# Patient Record
Sex: Male | Born: 1945 | Race: White | Hispanic: No | Marital: Married | Smoking: Former smoker
Health system: Southern US, Community
[De-identification: ages and names within clinical notes are randomized; demographics above are authoritative.]

## PROBLEM LIST (undated history)

## (undated) DIAGNOSIS — F0390 Unspecified dementia without behavioral disturbance: Secondary | ICD-10-CM

## (undated) DIAGNOSIS — E119 Type 2 diabetes mellitus without complications: Secondary | ICD-10-CM

## (undated) DIAGNOSIS — C4490 Unspecified malignant neoplasm of skin, unspecified: Secondary | ICD-10-CM

## (undated) DIAGNOSIS — C439 Malignant melanoma of skin, unspecified: Secondary | ICD-10-CM

## (undated) DIAGNOSIS — I6529 Occlusion and stenosis of unspecified carotid artery: Secondary | ICD-10-CM

## (undated) DIAGNOSIS — G4730 Sleep apnea, unspecified: Secondary | ICD-10-CM

## (undated) DIAGNOSIS — D72829 Elevated white blood cell count, unspecified: Secondary | ICD-10-CM

## (undated) DIAGNOSIS — I2510 Atherosclerotic heart disease of native coronary artery without angina pectoris: Secondary | ICD-10-CM

## (undated) DIAGNOSIS — E782 Mixed hyperlipidemia: Secondary | ICD-10-CM

## (undated) DIAGNOSIS — N289 Disorder of kidney and ureter, unspecified: Secondary | ICD-10-CM

## (undated) DIAGNOSIS — N200 Calculus of kidney: Secondary | ICD-10-CM

## (undated) DIAGNOSIS — R001 Bradycardia, unspecified: Secondary | ICD-10-CM

## (undated) DIAGNOSIS — Z9289 Personal history of other medical treatment: Secondary | ICD-10-CM

## (undated) DIAGNOSIS — I10 Essential (primary) hypertension: Secondary | ICD-10-CM

## (undated) HISTORY — PX: BACK SURGERY: SHX140

## (undated) HISTORY — DX: Personal history of other medical treatment: Z92.89

## (undated) HISTORY — PX: LUMBAR DISC SURGERY: SHX700

## (undated) HISTORY — PX: MELANOMA EXCISION: SHX5266

## (undated) HISTORY — DX: Atherosclerotic heart disease of native coronary artery without angina pectoris: I25.10

## (undated) HISTORY — DX: Occlusion and stenosis of unspecified carotid artery: I65.29

---

## 1988-10-27 HISTORY — PX: LITHOTRIPSY: SUR834

## 1998-05-23 ENCOUNTER — Ambulatory Visit (HOSPITAL_BASED_OUTPATIENT_CLINIC_OR_DEPARTMENT_OTHER): Admission: RE | Admit: 1998-05-23 | Discharge: 1998-05-23 | Payer: Self-pay | Admitting: *Deleted

## 1998-07-12 ENCOUNTER — Ambulatory Visit (HOSPITAL_COMMUNITY): Admission: RE | Admit: 1998-07-12 | Discharge: 1998-07-12 | Payer: Self-pay | Admitting: *Deleted

## 2000-11-11 ENCOUNTER — Ambulatory Visit: Admission: RE | Admit: 2000-11-11 | Discharge: 2000-11-11 | Payer: Self-pay | Admitting: Family Medicine

## 2010-02-26 HISTORY — PX: SKIN CANCER EXCISION: SHX779

## 2011-05-23 DIAGNOSIS — D485 Neoplasm of uncertain behavior of skin: Secondary | ICD-10-CM | POA: Diagnosis not present

## 2011-05-23 DIAGNOSIS — D235 Other benign neoplasm of skin of trunk: Secondary | ICD-10-CM | POA: Diagnosis not present

## 2011-05-23 DIAGNOSIS — L851 Acquired keratosis [keratoderma] palmaris et plantaris: Secondary | ICD-10-CM | POA: Diagnosis not present

## 2011-05-23 DIAGNOSIS — L570 Actinic keratosis: Secondary | ICD-10-CM | POA: Diagnosis not present

## 2011-06-04 DIAGNOSIS — IMO0001 Reserved for inherently not codable concepts without codable children: Secondary | ICD-10-CM | POA: Diagnosis not present

## 2011-06-04 DIAGNOSIS — E669 Obesity, unspecified: Secondary | ICD-10-CM | POA: Diagnosis not present

## 2011-06-04 DIAGNOSIS — E785 Hyperlipidemia, unspecified: Secondary | ICD-10-CM | POA: Diagnosis not present

## 2011-06-04 DIAGNOSIS — I10 Essential (primary) hypertension: Secondary | ICD-10-CM | POA: Diagnosis not present

## 2011-06-08 DIAGNOSIS — N200 Calculus of kidney: Secondary | ICD-10-CM | POA: Diagnosis not present

## 2011-06-08 DIAGNOSIS — E669 Obesity, unspecified: Secondary | ICD-10-CM | POA: Diagnosis not present

## 2011-06-08 DIAGNOSIS — E782 Mixed hyperlipidemia: Secondary | ICD-10-CM | POA: Diagnosis not present

## 2011-06-08 DIAGNOSIS — F0390 Unspecified dementia without behavioral disturbance: Secondary | ICD-10-CM | POA: Diagnosis not present

## 2011-06-08 DIAGNOSIS — I10 Essential (primary) hypertension: Secondary | ICD-10-CM | POA: Diagnosis not present

## 2011-06-08 DIAGNOSIS — G4733 Obstructive sleep apnea (adult) (pediatric): Secondary | ICD-10-CM | POA: Diagnosis not present

## 2011-10-11 DIAGNOSIS — E119 Type 2 diabetes mellitus without complications: Secondary | ICD-10-CM | POA: Diagnosis not present

## 2011-10-11 DIAGNOSIS — E782 Mixed hyperlipidemia: Secondary | ICD-10-CM | POA: Diagnosis not present

## 2011-10-17 DIAGNOSIS — E782 Mixed hyperlipidemia: Secondary | ICD-10-CM | POA: Diagnosis not present

## 2011-10-17 DIAGNOSIS — G4733 Obstructive sleep apnea (adult) (pediatric): Secondary | ICD-10-CM | POA: Diagnosis not present

## 2011-10-17 DIAGNOSIS — I10 Essential (primary) hypertension: Secondary | ICD-10-CM | POA: Diagnosis not present

## 2011-10-17 DIAGNOSIS — R42 Dizziness and giddiness: Secondary | ICD-10-CM | POA: Diagnosis not present

## 2011-10-17 DIAGNOSIS — E669 Obesity, unspecified: Secondary | ICD-10-CM | POA: Diagnosis not present

## 2011-10-17 DIAGNOSIS — N200 Calculus of kidney: Secondary | ICD-10-CM | POA: Diagnosis not present

## 2011-10-17 DIAGNOSIS — F0390 Unspecified dementia without behavioral disturbance: Secondary | ICD-10-CM | POA: Diagnosis not present

## 2011-11-26 DIAGNOSIS — Z85828 Personal history of other malignant neoplasm of skin: Secondary | ICD-10-CM | POA: Diagnosis not present

## 2011-11-26 DIAGNOSIS — L570 Actinic keratosis: Secondary | ICD-10-CM | POA: Diagnosis not present

## 2011-11-26 DIAGNOSIS — D485 Neoplasm of uncertain behavior of skin: Secondary | ICD-10-CM | POA: Diagnosis not present

## 2012-01-27 HISTORY — PX: CARDIAC CATHETERIZATION: SHX172

## 2012-02-03 ENCOUNTER — Encounter (HOSPITAL_COMMUNITY): Payer: Self-pay | Admitting: Emergency Medicine

## 2012-02-03 ENCOUNTER — Inpatient Hospital Stay (HOSPITAL_COMMUNITY)
Admission: AD | Admit: 2012-02-03 | Discharge: 2012-02-05 | DRG: 282 | Disposition: A | Discharge status: Home / Self Care | Payer: Medicare Other | Source: Other Acute Inpatient Hospital | Attending: Cardiology | Admitting: Cardiology

## 2012-02-03 DIAGNOSIS — F0390 Unspecified dementia without behavioral disturbance: Secondary | ICD-10-CM | POA: Diagnosis present

## 2012-02-03 DIAGNOSIS — I200 Unstable angina: Secondary | ICD-10-CM | POA: Diagnosis not present

## 2012-02-03 DIAGNOSIS — I2510 Atherosclerotic heart disease of native coronary artery without angina pectoris: Secondary | ICD-10-CM | POA: Diagnosis present

## 2012-02-03 DIAGNOSIS — I2582 Chronic total occlusion of coronary artery: Secondary | ICD-10-CM | POA: Diagnosis present

## 2012-02-03 DIAGNOSIS — G309 Alzheimer's disease, unspecified: Secondary | ICD-10-CM | POA: Diagnosis not present

## 2012-02-03 DIAGNOSIS — E785 Hyperlipidemia, unspecified: Secondary | ICD-10-CM | POA: Diagnosis not present

## 2012-02-03 DIAGNOSIS — I214 Non-ST elevation (NSTEMI) myocardial infarction: Principal | ICD-10-CM | POA: Diagnosis present

## 2012-02-03 DIAGNOSIS — Z79899 Other long term (current) drug therapy: Secondary | ICD-10-CM | POA: Diagnosis not present

## 2012-02-03 DIAGNOSIS — E119 Type 2 diabetes mellitus without complications: Secondary | ICD-10-CM | POA: Diagnosis present

## 2012-02-03 DIAGNOSIS — F0280 Dementia in other diseases classified elsewhere without behavioral disturbance: Secondary | ICD-10-CM | POA: Diagnosis not present

## 2012-02-03 DIAGNOSIS — I6529 Occlusion and stenosis of unspecified carotid artery: Secondary | ICD-10-CM | POA: Diagnosis present

## 2012-02-03 DIAGNOSIS — I219 Acute myocardial infarction, unspecified: Secondary | ICD-10-CM | POA: Diagnosis not present

## 2012-02-03 DIAGNOSIS — N289 Disorder of kidney and ureter, unspecified: Secondary | ICD-10-CM | POA: Diagnosis not present

## 2012-02-03 DIAGNOSIS — Z8249 Family history of ischemic heart disease and other diseases of the circulatory system: Secondary | ICD-10-CM

## 2012-02-03 DIAGNOSIS — I129 Hypertensive chronic kidney disease with stage 1 through stage 4 chronic kidney disease, or unspecified chronic kidney disease: Secondary | ICD-10-CM | POA: Diagnosis not present

## 2012-02-03 DIAGNOSIS — R079 Chest pain, unspecified: Secondary | ICD-10-CM | POA: Diagnosis not present

## 2012-02-03 DIAGNOSIS — E782 Mixed hyperlipidemia: Secondary | ICD-10-CM | POA: Diagnosis present

## 2012-02-03 DIAGNOSIS — Z23 Encounter for immunization: Secondary | ICD-10-CM | POA: Diagnosis not present

## 2012-02-03 DIAGNOSIS — E7800 Pure hypercholesterolemia, unspecified: Secondary | ICD-10-CM | POA: Diagnosis present

## 2012-02-03 DIAGNOSIS — I10 Essential (primary) hypertension: Secondary | ICD-10-CM | POA: Diagnosis present

## 2012-02-03 DIAGNOSIS — N189 Chronic kidney disease, unspecified: Secondary | ICD-10-CM | POA: Diagnosis not present

## 2012-02-03 DIAGNOSIS — R0989 Other specified symptoms and signs involving the circulatory and respiratory systems: Secondary | ICD-10-CM | POA: Diagnosis not present

## 2012-02-03 DIAGNOSIS — E876 Hypokalemia: Secondary | ICD-10-CM | POA: Diagnosis not present

## 2012-02-03 DIAGNOSIS — Z880 Allergy status to penicillin: Secondary | ICD-10-CM | POA: Diagnosis not present

## 2012-02-03 HISTORY — DX: Mixed hyperlipidemia: E78.2

## 2012-02-03 HISTORY — DX: Essential (primary) hypertension: I10

## 2012-02-03 HISTORY — DX: Type 2 diabetes mellitus without complications: E11.9

## 2012-02-03 LAB — BASIC METABOLIC PANEL
BUN: 18 mg/dL (ref 6–23)
CO2: 23 mEq/L (ref 19–32)
Chloride: 100 mEq/L (ref 96–112)
Creatinine, Ser: 1 mg/dL (ref 0.50–1.35)
GFR calc Af Amer: 88 mL/min — ABNORMAL LOW (ref >90)
Glucose, Bld: 206 mg/dL — ABNORMAL HIGH (ref 70–99)
Potassium: 3.9 mEq/L (ref 3.5–5.1)

## 2012-02-03 LAB — TROPONIN I: Troponin I: <0.3 ng/mL (ref <0.30)

## 2012-02-03 MED ORDER — SODIUM CHLORIDE 0.9 % IJ SOLN: 3.0000 mL | INTRAMUSCULAR | Status: DC | PRN

## 2012-02-03 MED ORDER — SODIUM CHLORIDE 0.9 % IJ SOLN: 3.0000 mL | Freq: Two times a day (BID) | INTRAMUSCULAR | Status: DC

## 2012-02-03 MED ORDER — ASPIRIN EC 81 MG PO TBEC
81.0000 mg | DELAYED_RELEASE_TABLET | Freq: Every day | ORAL | Status: DC
  Administered 2012-02-05: 10:00:00 81 mg via ORAL
  Filled 2012-02-03 (×2): qty 1

## 2012-02-03 MED ORDER — ASPIRIN 300 MG RE SUPP
300.0000 mg | RECTAL | Status: AC
  Filled 2012-02-03: qty 1

## 2012-02-03 MED ORDER — SODIUM CHLORIDE 0.9 % IV SOLN: 250.0000 mL | INTRAVENOUS | Status: DC | PRN

## 2012-02-03 MED ORDER — SODIUM CHLORIDE 0.9 % IJ SOLN
3.0000 mL | Freq: Two times a day (BID) | INTRAMUSCULAR | Status: DC
  Administered 2012-02-03 – 2012-02-04 (×3): 3 mL via INTRAVENOUS

## 2012-02-03 MED ORDER — RIVASTIGMINE 4.6 MG/24HR TD PT24
4.6000 mg | MEDICATED_PATCH | Freq: Every day | TRANSDERMAL | Status: DC
  Administered 2012-02-03 – 2012-02-05 (×3): 4.6 mg via TRANSDERMAL
  Filled 2012-02-03 (×4): qty 1

## 2012-02-03 MED ORDER — ATORVASTATIN CALCIUM 80 MG PO TABS
80.0000 mg | ORAL_TABLET | Freq: Every day | ORAL | Status: DC
  Administered 2012-02-03 – 2012-02-04 (×2): 80 mg via ORAL
  Filled 2012-02-03 (×4): qty 1

## 2012-02-03 MED ORDER — INFLUENZA VIRUS VACC SPLIT PF IM SUSP
0.5000 mL | INTRAMUSCULAR | Status: AC
  Administered 2012-02-05: 10:00:00 0.5 mL via INTRAMUSCULAR
  Filled 2012-02-03: qty 0.5

## 2012-02-03 MED ORDER — ONDANSETRON HCL 4 MG/2ML IJ SOLN: 4.0000 mg | Freq: Four times a day (QID) | INTRAMUSCULAR | Status: DC | PRN

## 2012-02-03 MED ORDER — NITROGLYCERIN 0.4 MG SL SUBL: 0.4000 mg | SUBLINGUAL_TABLET | SUBLINGUAL | Status: DC | PRN

## 2012-02-03 MED ORDER — CLOPIDOGREL BISULFATE 75 MG PO TABS
75.0000 mg | ORAL_TABLET | Freq: Every day | ORAL | Status: DC
  Administered 2012-02-04 – 2012-02-05 (×2): 75 mg via ORAL
  Filled 2012-02-03 (×4): qty 1

## 2012-02-03 MED ORDER — ASPIRIN 81 MG PO CHEW
324.0000 mg | CHEWABLE_TABLET | ORAL | Status: AC
Start: 2012-02-03 — End: 2012-02-03
  Administered 2012-02-03: 324 mg via ORAL
  Filled 2012-02-03: qty 4

## 2012-02-03 MED ORDER — METOPROLOL TARTRATE 12.5 MG HALF TABLET
12.5000 mg | ORAL_TABLET | Freq: Four times a day (QID) | ORAL | Status: DC
  Administered 2012-02-03 – 2012-02-04 (×4): 12.5 mg via ORAL
  Filled 2012-02-03 (×9): qty 1

## 2012-02-03 MED ORDER — ACETAMINOPHEN 325 MG PO TABS: 650.0000 mg | ORAL_TABLET | ORAL | Status: DC | PRN

## 2012-02-03 MED ORDER — HEPARIN (PORCINE) IN NACL 100-0.45 UNIT/ML-% IJ SOLN
1600.0000 [IU]/h | INTRAMUSCULAR | Status: DC
  Administered 2012-02-03: 1250 [IU]/h via INTRAVENOUS
  Administered 2012-02-04: 1600 [IU]/h via INTRAVENOUS
  Filled 2012-02-03: qty 250

## 2012-02-03 MED ORDER — ASPIRIN 81 MG PO CHEW
324.0000 mg | CHEWABLE_TABLET | ORAL | Status: AC
  Administered 2012-02-04: 324 mg via ORAL
  Filled 2012-02-03: qty 4

## 2012-02-03 MED ORDER — SODIUM CHLORIDE 0.9 % IV SOLN: INTRAVENOUS | Status: DC

## 2012-02-03 NOTE — H&P (Addendum)
Physician History and Physical    Paul Strickland MRN: [id number] DOB/AGE: 04/18/1945 66 y.o. Admit date: 02/03/2012  Primary Care Physician: Almond Lint Primary Cardiologist: None  HPI: 66 yo with history of HTN and diet-controlled DM presented with chest pain and ER findings consistent with NSTEMI.  Patient had an episode of sharp chest pain and dyspnea on Friday while climbing up some steps.  This resolved with rest.  Last night, he woke up with severe substernal chest pain, weakness, and diaphoresis.  Family took him to ER at [facility].  Pain did not totally resolved for 2-3 hours. He had NTG and morphine.  Initial ECG was relatively normal but repeat ECG showed diffuse shallow anterior and lateral T wave inversions.  Cardiac enzymes initially negative but repeat positive.  Patient was started on heparin gtt and Plavix in ER at [facility] and was transferred here.  He currently has no chest pain.  Prior to last Friday, he had felt fatigued in general but had not had dyspnea or chest pain.  Of note, he has early-onset dementia (mother had as well) and is wearing an Exelon patch.   PMH: 1. HTN 2. Type II diabetes, diet-controlled.  3. Early dementia: On Exelon, Mother had early-onset dementia.   Home Meds: Lisinopril/HCT 10/12.5 daily Exelon patch  ROS:  Complete and found to be negative unless listed above   FH: Father with MI at 36, 2 uncles with MIs.  Mother with early-onset dementia  History   Social History  . Marital Status: Married    Spouse Name: N/A    Number of Children: N/A  . Years of Education: N/A   Occupational History  . Not on file.   Social History Main Topics  . Smoking status: Nonsmoker  . Smokeless tobacco: Not on file  . Alcohol Use: Not on file  . Drug Use: Not on file  . Sexually Active: Not on file   Other Topics Concern  . Lives in [place], owns junkyard   Social History Narrative  . No narrative on file     No prescriptions prior to  admission    Physical Exam: Blood pressure 129/72, pulse 58, resp. rate 17, SpO2 98.00%.  General: NAD Neck: No JVD, no thyromegaly or thyroid nodule.  Lungs: Clear to auscultation bilaterally with normal respiratory effort. CV: Nondisplaced PMI.  Heart regular S1/S2, no S3/S4, no murmur.  No peripheral edema.  Right carotid bruit.  Normal pedal pulses.  Abdomen: Soft, nontender, no hepatosplenomegaly, no distention.  Skin: Intact without lesions or rashes.  Neurologic: Alert and oriented x 3.  Psych: Normal affect. Extremities: No clubbing or cyanosis.  HEENT: Normal.   Labs (From [facility]): Creatinine 1.48, BUN 27, K 3.3, HCO3 26, HCT 48.1, plts normal, WBC 14.7, D dimer negative TnI 0.01 => 0.12, CKMB 2.1   Radiology: - CXR at [facility] showed no acute abnormalities  EKG: NSR, anterior and lateral shallow T wave inversions (progressive from prior ECG earlier in am that was essentially normal).   ASSESSMENT AND PLAN:  1.  CAD: NSTEMI.  Currently chest-pain free.  - Continue ASA 81, heparin gtt, metoprolol - Started on Plavix at [facility], will continue.  - Use high dose statin, atorvastatin 80.   - Cycle cardiac enzymes to peak.  2. Elevated creatinine: Creatinine 1.48 at [facility] [place], uncertain of baseline.  Will hydrate prior to cath.  Hold lisinopril/HCTZ for now.  3. Dementia: Early-onset, mild.  Continue Exelon patch.   Signed: Marca Ancona 02/03/2012,  2:02 PM Co-Sign MD   '

## 2012-02-03 NOTE — Progress Notes (Signed)
ANTICOAGULATION CONSULT NOTE - Initial Consult  Pharmacy Consult:  Heparin Indication:  ACS  Allergies not on file - PCN  Patient Measurements: Height: 5\' 8"  (172.7 cm) Weight: 219 lb (99.338 kg) IBW/kg (Calculated) : 68.4  Heparin Dosing Weight: 90 kg  Vital Signs: BP: 129/72 mmHg (12/08 1358) Pulse Rate: 58  (12/08 1358)  Labs: No results found for this basename: HGB:2,HCT:3,PLT:3,APTT:3,LABPROT:3,INR:3,HEPARINUNFRC:3,CREATININE:3,CKTOTAL:3,CKMB:3,TROPONINI:3 in the last 72 hours  CrCl is unknown because no creatinine reading has been taken.   Medical History: No past medical history on file.     Assessment: 13 YOM with NSTEMI transferred from [facility] to [facility] for further management.  He was started on heparin gtt at 1220 units/hr at 1050 today, no bolus.  No interruption since its initiation per patient.  CBC from [facility] normal.   Goal of Therapy:  Heparin level 0.3-0.7 units/ml Monitor platelets by anticoagulation protocol: Yes    Plan:  - Continue heparin gtt at 1250 units/hr (rounded up from [facility] dose) - Check 6 hr HL - Daily HL / CBC - F/U KCL supplementation    Ashauna Bertholf D. Laney Potash, PharmD, BCPS Pager:  [phone] 02/03/2012, 2:40 PM

## 2012-02-03 NOTE — Progress Notes (Signed)
ANTICOAGULATION CONSULT NOTE - Follow Up Consult  Pharmacy Consult for  Heparin Indication: ACS/STEMI  Allergies  Allergen Reactions  . Penicillins Rash    Patient Measurements: Height: 5\' 8"  (172.7 cm) Weight: 219 lb (99.338 kg) IBW/kg (Calculated) : 68.4  Heparin Dosing Weight: 90 kg  Vital Signs: Temp: 97.9 F (36.6 C) (12/08 1446) Temp src: Oral (12/08 1446) BP: 119/84 mmHg (12/08 1446) Pulse Rate: 63  (12/08 1446)  Labs:  Basename 02/03/12 1646  HGB --  HCT --  PLT --  APTT --  LABPROT --  INR --  HEPARINUNFRC 0.21*  CREATININE 1.00  CKTOTAL --  CKMB --  TROPONINI <0.30    Estimated Creatinine Clearance: 81.9 ml/min (by C-G formula based on Cr of 1).  Assessment:  Initial heparin level is subtherapeutic on 1250 units/hr.   Goal of Therapy:  Heparin level 0.3-0.7 units/ml Monitor platelets by anticoagulation protocol: Yes   Plan:   Increase heparin to 1450 units/hr.  Next heparin level ~8 hrs after increase.  Daily heparin level and CBC.  Dennie Fetters, RPh Pager: [phone] 02/03/2012,7:17 PM

## 2012-02-04 ENCOUNTER — Encounter (HOSPITAL_COMMUNITY)
Admission: AD | Disposition: A | Discharge status: Home / Self Care | Payer: Self-pay | Source: Other Acute Inpatient Hospital | Attending: Cardiology

## 2012-02-04 DIAGNOSIS — I2510 Atherosclerotic heart disease of native coronary artery without angina pectoris: Secondary | ICD-10-CM

## 2012-02-04 DIAGNOSIS — I219 Acute myocardial infarction, unspecified: Secondary | ICD-10-CM | POA: Diagnosis not present

## 2012-02-04 HISTORY — PX: LEFT HEART CATHETERIZATION WITH CORONARY ANGIOGRAM: SHX5451

## 2012-02-04 LAB — CBC
HCT: 43 % (ref 39.0–52.0)
Hemoglobin: 14.8 g/dL (ref 13.0–17.0)
MCH: 28.8 pg (ref 26.0–34.0)
MCH: 29.7 pg (ref 26.0–34.0)
MCHC: 33.8 g/dL (ref 30.0–36.0)
MCHC: 34.4 g/dL (ref 30.0–36.0)
Platelets: 277 10*3/uL (ref 150–400)
RBC: 4.98 MIL/uL (ref 4.22–5.81)
RDW: 12.6 % (ref 11.5–15.5)

## 2012-02-04 LAB — GLUCOSE, CAPILLARY
Glucose-Capillary: 177 mg/dL — ABNORMAL HIGH (ref 70–99)
Glucose-Capillary: 137 mg/dL — ABNORMAL HIGH (ref 70–99)

## 2012-02-04 LAB — BASIC METABOLIC PANEL
Calcium: 8.8 mg/dL (ref 8.4–10.5)
GFR calc Af Amer: 83 mL/min — ABNORMAL LOW (ref >90)
GFR calc non Af Amer: 71 mL/min — ABNORMAL LOW (ref >90)
Potassium: 4.1 mEq/L (ref 3.5–5.1)
Sodium: 136 mEq/L (ref 135–145)

## 2012-02-04 LAB — LIPID PANEL
Cholesterol: 164 mg/dL (ref 0–200)
Triglycerides: 269 mg/dL — ABNORMAL HIGH (ref <150)

## 2012-02-04 LAB — TSH: TSH: 0.609 u[IU]/mL (ref 0.350–4.500)

## 2012-02-04 SURGERY — LEFT HEART CATHETERIZATION WITH CORONARY ANGIOGRAM
Anesthesia: LOCAL

## 2012-02-04 MED ORDER — ISOSORBIDE MONONITRATE ER 30 MG PO TB24
30.0000 mg | ORAL_TABLET | Freq: Every day | ORAL | Status: DC
  Administered 2012-02-04 – 2012-02-05 (×2): 30 mg via ORAL
  Filled 2012-02-04 (×3): qty 1

## 2012-02-04 MED ORDER — HEPARIN SODIUM (PORCINE) 1000 UNIT/ML IJ SOLN
INTRAMUSCULAR | Status: AC
  Filled 2012-02-04: qty 1

## 2012-02-04 MED ORDER — ONDANSETRON HCL 4 MG/2ML IJ SOLN: 4.0000 mg | Freq: Four times a day (QID) | INTRAMUSCULAR | Status: DC | PRN

## 2012-02-04 MED ORDER — HEPARIN (PORCINE) IN NACL 2-0.9 UNIT/ML-% IJ SOLN
INTRAMUSCULAR | Status: AC
  Filled 2012-02-04: qty 2000

## 2012-02-04 MED ORDER — SODIUM CHLORIDE 0.9 % IV SOLN: INTRAVENOUS | Status: AC

## 2012-02-04 MED ORDER — METOPROLOL TARTRATE 25 MG PO TABS
25.0000 mg | ORAL_TABLET | Freq: Two times a day (BID) | ORAL | Status: DC
  Administered 2012-02-04 – 2012-02-05 (×3): 25 mg via ORAL
  Filled 2012-02-04 (×3): qty 1

## 2012-02-04 MED ORDER — MIDAZOLAM HCL 2 MG/2ML IJ SOLN
INTRAMUSCULAR | Status: AC
  Filled 2012-02-04: qty 2

## 2012-02-04 MED ORDER — LIDOCAINE HCL (PF) 1 % IJ SOLN
INTRAMUSCULAR | Status: AC
  Filled 2012-02-04: qty 30

## 2012-02-04 MED ORDER — NITROGLYCERIN 0.2 MG/ML ON CALL CATH LAB
INTRAVENOUS | Status: AC
  Filled 2012-02-04: qty 1

## 2012-02-04 MED ORDER — VERAPAMIL HCL 2.5 MG/ML IV SOLN
INTRAVENOUS | Status: AC
  Filled 2012-02-04: qty 2

## 2012-02-04 MED ORDER — FENTANYL CITRATE 0.05 MG/ML IJ SOLN
INTRAMUSCULAR | Status: AC
  Filled 2012-02-04: qty 2

## 2012-02-04 MED FILL — Heparin Sodium (Porcine) 100 Unt/ML in Sodium Chloride 0.45%: INTRAMUSCULAR | Qty: 250 | Status: AC

## 2012-02-04 NOTE — Progress Notes (Signed)
Utilization Review Completed.   Jakayla Schweppe, RN, BSN Nurse Case Manager  [phone]  

## 2012-02-04 NOTE — Progress Notes (Signed)
ANTICOAGULATION CONSULT NOTE - Follow Up Consult  Pharmacy Consult for heparin Indication: chest pain/ACS  Labs:  Basename 02/04/12 0155 02/04/12 0143 02/03/12 2000 02/03/12 1646  HGB -- 13.3 -- --  HCT -- 39.3 -- --  PLT -- 277 -- --  APTT -- -- -- --  LABPROT -- -- -- --  INR -- -- -- --  HEPARINUNFRC 0.25* -- -- 0.21*  CREATININE -- 1.05 -- 1.00  CKTOTAL -- -- -- --  CKMB -- -- -- --  TROPONINI -- <0.30 <0.30 <0.30    Assessment: 66yo male remains subtherapeutic on heparin after rate increase; per RN rate change happened later than ordered but nearly 6hr prior to lab and IV pump has been beeping frequently.  Goal of Therapy:  Heparin level 0.3-0.7 units/ml   Plan:  Will increase heparin gtt by 1-2 units/kg/hr to 1600 units/hr and check level in 6hr.  Colleen Can PharmD BCPS 02/04/2012,2:53 AM

## 2012-02-04 NOTE — Progress Notes (Signed)
Inpatient Diabetes Program Recommendations  AACE/ADA: New Consensus Statement on Inpatient Glycemic Control (2013)  Target Ranges:  Prepandial:   less than 140 mg/dL      Peak postprandial:   less than 180 mg/dL (1-2 hours)      Critically ill patients:  140 - 180 mg/dL   Inpatient Diabetes Program Recommendations Correction (SSI): Start Novolog Moderate scale TID A1C pending. Thank you  Piedad Climes [facility] Inpatient Diabetes Coordinator [phone]

## 2012-02-04 NOTE — Progress Notes (Signed)
  Echocardiogram 2D Echocardiogram has been performed.  Paul Strickland 02/04/2012, 6:42 PM

## 2012-02-04 NOTE — Interval H&P Note (Signed)
History and Physical Interval Note:  02/04/2012 9:10 AM  Paul Strickland  has presented today for cardiac cath with the diagnosis of NSTEMI  The various methods of treatment have been discussed with the patient and family. After consideration of risks, benefits and other options for treatment, the patient has consented to  Procedure(s) (LRB) with comments: LEFT HEART CATHETERIZATION WITH CORONARY ANGIOGRAM (N/A) as a surgical intervention .  The patient's history has been reviewed, patient examined, no change in status, stable for surgery.  I have reviewed the patient's chart and labs.  Questions were answered to the patient's satisfaction.     MCALHANY,CHRISTOPHER

## 2012-02-04 NOTE — Progress Notes (Signed)
TR BAND REMOVAL  LOCATION:    right radial  DEFLATED PER PROTOCOL:    yes  TIME BAND OFF / DRESSING APPLIED:    1245   SITE UPON ARRIVAL:    Level 0  SITE AFTER BAND REMOVAL:    Level 0  REVERSE ALLEN'S TEST:     positive  CIRCULATION SENSATION AND MOVEMENT:    Within Normal Limits   yes  COMMENTS:   Tolerated procedure well 

## 2012-02-04 NOTE — CV Procedure (Addendum)
Cardiac Catheterization Operative Report  Paul Strickland [id number] 12/9/201310:00 AM No primary provider on file.  Procedure Performed:  1. Left Heart Catheterization 2. Selective Coronary Angiography 3. Left ventricular angiogram  Operator: Verne Carrow, MD  Arterial access site:  Right radial artery.   Indication:  66 yo WM with history of DM, HTN admitted on transfer from [facility] with chest pain, one slightly abnormal troponin there but negative cardiac markers here. Cardiac cath to exclude obstructive CAD.                                      Procedure Details: The risks, benefits, complications, treatment options, and expected outcomes were discussed with the patient. The patient and/or family concurred with the proposed plan, giving informed consent. The patient was brought to the cath lab after IV hydration was begun and oral premedication was given. The patient was further sedated with Versed and Fentanyl. The right wrist was assessed with an Allens test which was positive. The right wrist was prepped and draped in a sterile fashion. 1% lidocaine was used for local anesthesia. Using the modified Seldinger access technique, a 5 French sheath was placed in the right radial artery. 3 mg Verapamil was given through the sheath. 5000 units IV heparin was given. Standard diagnostic catheters were used to perform selective coronary angiography. A pigtail catheter was used to perform a left ventricular angiogram. The sheath was removed from the right radial artery and a Terumo hemostasis band was applied at the arteriotomy site on the right wrist.   There were no immediate complications. The patient was taken to the recovery area in stable condition.   Hemodynamic Findings: Central aortic pressure: 143/76 Left ventricular pressure: 150/11/23  Angiographic Findings:  Left main: No obstructive disease.    Left Anterior Descending Artery: Moderate caliber vessel in  the proximal and mid segment with tapering to small caliber vessel in the mid and distal portion. The proximal vessel has diffuse 40% stenosis. The mid vessel has a focal 50% stenosis. The mid vessel appears to be a 2.0 mm vessel. The distal vessel is 1.5 mm in caliber and has mild plaque disease. The diagonal branch is moderate sized and has mild plaque disease.   Circumflex Artery: Moderate caliber vessel with a moderate caliber first obtuse marginal branch. Just beyond the takeoff of the marginal branch, the AV groove Circumflex becomes very small in caliber and has a 99% stenosis. This vessel is 1.5-1.7 mm in diameter.   Right Coronary Artery: Large dominant artery with mild proximal vessel plaque, serial 40% stenoses mid vessel and focal 50-60% distal stenosis. The PDA and PLA have mild plaque disease.   Left Ventricular Angiogram: LVEF 55%.   Impression: 1. Unstable angina secondary to severe stenosis in very small caliber Circumflex lesion as above. This vessel is too small for PCI. 2. Moderate non-obstructive disease in the LAD and RCA. 3. Preserved LV systolic function  Recommendations: I would recommend medical management of his CAD. His likely culprit for his unstable angina is his very small caliber mid AV groove Circumflex stenosis. This vessel is too small for PCI. Any attempt at PCI would also jeopardize the much larger obtuse marginal branch that arises just before the severe stenosis. Because of his presentation, would favor treatment with ASA and Plavix with addition of beta blocker, statin and long acting nitrate therapy. Monitor overnight and d/c  home in am if stable. Will check echo and carotid artery dopplers today.        Complications:  None. The patient tolerated the procedure well.

## 2012-02-05 ENCOUNTER — Encounter (HOSPITAL_COMMUNITY): Payer: Self-pay | Admitting: Physician Assistant

## 2012-02-05 DIAGNOSIS — E7800 Pure hypercholesterolemia, unspecified: Secondary | ICD-10-CM | POA: Diagnosis present

## 2012-02-05 DIAGNOSIS — I200 Unstable angina: Secondary | ICD-10-CM | POA: Diagnosis present

## 2012-02-05 DIAGNOSIS — N289 Disorder of kidney and ureter, unspecified: Secondary | ICD-10-CM | POA: Diagnosis present

## 2012-02-05 DIAGNOSIS — I10 Essential (primary) hypertension: Secondary | ICD-10-CM | POA: Diagnosis present

## 2012-02-05 DIAGNOSIS — R0989 Other specified symptoms and signs involving the circulatory and respiratory systems: Secondary | ICD-10-CM | POA: Diagnosis not present

## 2012-02-05 DIAGNOSIS — I214 Non-ST elevation (NSTEMI) myocardial infarction: Secondary | ICD-10-CM | POA: Diagnosis not present

## 2012-02-05 DIAGNOSIS — E782 Mixed hyperlipidemia: Secondary | ICD-10-CM | POA: Diagnosis present

## 2012-02-05 DIAGNOSIS — I2510 Atherosclerotic heart disease of native coronary artery without angina pectoris: Secondary | ICD-10-CM | POA: Diagnosis not present

## 2012-02-05 DIAGNOSIS — E119 Type 2 diabetes mellitus without complications: Secondary | ICD-10-CM | POA: Diagnosis present

## 2012-02-05 LAB — CBC
HCT: 39.4 % (ref 39.0–52.0)
Hemoglobin: 13.3 g/dL (ref 13.0–17.0)
RBC: 4.54 MIL/uL (ref 4.22–5.81)
WBC: 12.4 10*3/uL — ABNORMAL HIGH (ref 4.0–10.5)

## 2012-02-05 LAB — GLUCOSE, CAPILLARY: Glucose-Capillary: 128 mg/dL — ABNORMAL HIGH (ref 70–99)

## 2012-02-05 MED ORDER — ASPIRIN 81 MG PO TABS: 81.0000 mg | ORAL_TABLET | Freq: Every day | ORAL | Status: DC

## 2012-02-05 MED ORDER — ISOSORBIDE MONONITRATE ER 30 MG PO TB24: 30.0000 mg | ORAL_TABLET | Freq: Every day | ORAL | Status: DC

## 2012-02-05 MED ORDER — ATORVASTATIN CALCIUM 80 MG PO TABS: 80.0000 mg | ORAL_TABLET | Freq: Every day | ORAL | Status: AC

## 2012-02-05 MED ORDER — NITROGLYCERIN 0.4 MG SL SUBL: 0.4000 mg | SUBLINGUAL_TABLET | SUBLINGUAL | Status: DC | PRN

## 2012-02-05 MED ORDER — METOPROLOL TARTRATE 25 MG PO TABS: 25.0000 mg | ORAL_TABLET | Freq: Two times a day (BID) | ORAL | Status: DC

## 2012-02-05 MED ORDER — CLOPIDOGREL BISULFATE 75 MG PO TABS: 75.0000 mg | ORAL_TABLET | Freq: Every day | ORAL | Status: DC

## 2012-02-05 NOTE — Discharge Summary (Signed)
CARDIOLOGY DISCHARGE SUMMARY   Patient ID: Paul Strickland MRN: [id number] DOB/AGE: Aug 13, 1945 66 y.o.  Admit date: 02/03/2012 Discharge date: 02/05/2012  Primary Discharge Diagnosis:    NSTEMI (non-ST elevated myocardial infarction)  Secondary Discharge Diagnosis:    Coronary atherosclerosis of native coronary artery     acute renal insufficiency    . HTN (hypertension)   . Diabetes mellitus type 2, diet-controlled   . Mixed dyslipidemia     Low HDL, elevated TRIG   Procedure Performed:  1. Left Heart Catheterization 2. Selective Coronary Angiography 3. Left ventricular angiogram 4. 2-D echocardiogram  Hospital Course: Paul Strickland is a 66 year old male with no previous cardiac history. He went to [facility] with chest pain. His cardiac markers there was slightly elevated so he was [facility] for further evaluation and treatment.  He was pain free on medical therapy including aspirin, heparin, beta blocker, Plavix and statin. A lipid profile was performed with results below. His white count was slightly elevated but he had no fever, cough or chills. His chest x-ray had been performed at [facility] and showed no acute abnormalities. His ECG was followed and he had new anterior and lateral T-wave inversions. He had acute renal insufficiency with a creatinine of 1.48 at [facility]. His lisinopril/HCTZ was held and he was hydrated. His BUN and creatinine improved. He was continued on his Exelon for early onset dementia. His blood sugars were followed and managed with sliding scale insulin. Hemoglobin A1c was slightly elevated at 7.1. He is encouraged to stick to a diabetic diet and followup with primary care. A 2-D echocardiogram was performed, results pending. He was taken to the cath lab on 02/04/2012, results below, with medical therapy recommended.  On 02/05/2012, he was seen by Dr. Clifton James and by cardiac rehabilitation. He had a carotid bruit so carotid Dopplers with  or performed but the preliminary report is that there is no hemodynamically significant left internal carotid artery stenosis and the right carotid artery had stenosis with percentages pending. He will followup on this as an outpatient. Paul Strickland was ambulating without chest pain or shortness of breath. He was referred to outpatient cardiac rehabilitation and considered stable for discharge, to follow up as an outpatient.  Labs: Lab Results  Component Value Date   WBC 12.4* 02/05/2012   HGB 13.3 02/05/2012   HCT 39.4 02/05/2012   MCV 86.8 02/05/2012   PLT 281 02/05/2012     Lab 02/04/12 0143  NA 136  K 4.1  CL 103  CO2 26  BUN 16  CREATININE 1.05  CALCIUM 8.8  PROT --  BILITOT --  ALKPHOS --  ALT --  AST --  GLUCOSE 170*    Basename 02/04/12 0143 02/03/12 2000 02/03/12 1646  CKTOTAL -- -- --  CKMB -- -- --  CKMBINDEX -- -- --  TROPONINI <0.30 <0.30 <0.30   Lipid Panel     Component Value Date/Time   CHOL 164 02/04/2012 0143   TRIG 269* 02/04/2012 0143   HDL 27* 02/04/2012 0143   CHOLHDL 6.1 02/04/2012 0143   VLDL 54* 02/04/2012 0143   LDLCALC 83 02/04/2012 0143   Lab Results  Component Value Date   HGBA1C 7.1* 02/03/2012    Cardiac Cath:  02/04/2012 Left main: No obstructive disease.  Left Anterior Descending Artery: Moderate caliber vessel in the proximal and mid segment with tapering to small caliber vessel in the mid and distal portion. The proximal vessel has diffuse 40% stenosis. The  mid vessel has a focal 50% stenosis. The mid vessel appears to be a 2.0 mm vessel. The distal vessel is 1.5 mm in caliber and has mild plaque disease. The diagonal branch is moderate sized and has mild plaque disease.  Circumflex Artery: Moderate caliber vessel with a moderate caliber first obtuse marginal branch. Just beyond the takeoff of the marginal branch, the AV groove Circumflex becomes very small in caliber and has a 99% stenosis. This vessel is 1.5-1.7 mm in diameter.  Right  Coronary Artery: Large dominant artery with mild proximal vessel plaque, serial 40% stenoses mid vessel and focal 50-60% distal stenosis. The PDA and PLA have mild plaque disease.  Left Ventricular Angiogram: LVEF 55%.   EKG:  05-Feb-2012 05:59:54 [facility] ROUTINE RECORD Sinus bradycardia with sinus arrhythmia Nonspecific T wave abnormality Abnormal ECG 83mm/s 47mm/mV 100Hz  8.0.1 12SL 241 HD CID: 1 Referred by: PETER [place] Unconfirmed Vent. rate 55 BPM PR interval 186 ms QRS duration 98 ms QT/QTc 450/430 ms P-R-T axes 65 50 90  FOLLOW UP PLANS AND APPOINTMENTS Allergies  Allergen Reactions  . Penicillins Rash     Medication List     As of 02/05/2012 10:12 AM    STOP taking these medications         lisinopril-hydrochlorothiazide 10-12.5 MG per tablet   Commonly known as: PRINZIDE,ZESTORETIC      TAKE these medications         aspirin 81 MG tablet   Take 1 tablet (81 mg total) by mouth daily.      atorvastatin 80 MG tablet   Commonly known as: LIPITOR   Take 1 tablet (80 mg total) by mouth daily.      clopidogrel 75 MG tablet   Commonly known as: PLAVIX   Take 1 tablet (75 mg total) by mouth daily.      isosorbide mononitrate 30 MG 24 hr tablet   Commonly known as: IMDUR   Take 1 tablet (30 mg total) by mouth daily.      metoprolol tartrate 25 MG tablet   Commonly known as: LOPRESSOR   Take 1 tablet (25 mg total) by mouth 2 (two) times daily.      nitroGLYCERIN 0.4 MG SL tablet   Commonly known as: NITROSTAT   Place 1 tablet (0.4 mg total) under the tongue every 5 (five) minutes as needed for chest pain.      Rivastigmine 13.3 MG/24HR Pt24   Place 1 patch onto the skin daily.         Discharge Orders    Future Orders Please Complete By Expires   Amb Referral to Cardiac Rehabilitation      Comments:   Pt agrees to [facility]. CRP in [place], will send referral.       BRING ALL MEDICATIONS WITH YOU TO FOLLOW UP APPOINTMENTS  Time  spent with patient to include physician time: 39 min Signed: Theodore Demark 02/05/2012, 10:12 AM Co-Sign MD

## 2012-02-05 NOTE — Progress Notes (Signed)
*  PRELIMINARY RESULTS* Vascular Ultrasound Carotid Duplex (Doppler) has been completed.  There is evidence of elevated right internal carotid artery velocities, suggestive of a <40% stenosis. There is no obvious evidence of hemodynamically significant left internal carotid artery stenosis. The right external carotid artery demonstrates elevated velocities, suggestive of stenosis. Vertebral arteries are patent with antegrade flow.  02/05/2012 9:32 AM Gertie Fey, RDMS, RDCS

## 2012-02-05 NOTE — Progress Notes (Signed)
CARDIAC REHAB PHASE I   PRE:  Rate/Rhythm: 55 SB  BP:  Supine: 131/82  Sitting:   Standing:    SaO2:   MODE:  Ambulation: 1000 ft   POST:  Rate/Rhythem: 72 SR  BP:  Supine: 143/83  Sitting:   Standing:    SaO2:  0830-0945 Pt tolerated ambulation well without c/o of cp or SOB. VS stable Completed MI education with pt and wife. They voice understanding. Pt agrees to Outpt. CRP in [place], will send referral.  Beatrix Fetters

## 2012-02-05 NOTE — Discharge Summary (Signed)
See full note this am. cdm 

## 2012-02-05 NOTE — Progress Notes (Signed)
SUBJECTIVE: No chest pain. No SOB.   BP 135/69  Pulse 56  Temp 98 F (36.7 C) (Oral)  Resp 13  Ht 5\' 8"  (1.727 m)  Wt 219 lb 2.2 oz (99.4 kg)  BMI 33.32 kg/m2  SpO2 98%  Intake/Output Summary (Last 24 hours) at 02/05/12 0725 Last data filed at 02/04/12 1900  Gross per 24 hour  Intake  577.5 ml  Output    500 ml  Net   77.5 ml    PHYSICAL EXAM General: Well developed, well nourished, in no acute distress. Alert and oriented x 3.  Psych:  Good affect, responds appropriately Neck: No JVD. No masses noted.  Lungs: Clear bilaterally with no wheezes or rhonci noted.  Heart: RRR with no murmurs noted. Abdomen: Bowel sounds are present. Soft, non-tender.  Extremities: No lower extremity edema. Right wrist cath site ok.   LABS: Basic Metabolic Panel:  Basename 02/04/12 0143 02/03/12 1646  NA 136 134*  K 4.1 3.9  CL 103 100  CO2 26 23  GLUCOSE 170* 206*  BUN 16 18  CREATININE 1.05 1.00  CALCIUM 8.8 8.6  MG -- --  PHOS -- --   CBC:  Basename 02/05/12 0445 02/04/12 1340  WBC 12.4* 10.9*  NEUTROABS -- --  HGB 13.3 14.8  HCT 39.4 43.0  MCV 86.8 86.3  PLT 281 300   Cardiac Enzymes:  Basename 02/04/12 0143 02/03/12 2000 02/03/12 1646  CKTOTAL -- -- --  CKMB -- -- --  CKMBINDEX -- -- --  TROPONINI <0.30 <0.30 <0.30   Fasting Lipid Panel:  Basename 02/04/12 0143  CHOL 164  HDL 27*  LDLCALC 83  TRIG 409*  CHOLHDL 6.1  LDLDIRECT --    Current Meds:    . aspirin EC  81 mg Oral Daily  . atorvastatin  80 mg Oral q1800  . clopidogrel  75 mg Oral Q breakfast  . [COMPLETED] fentaNYL      . [COMPLETED] heparin      . [COMPLETED] heparin      . influenza  inactive virus vaccine  0.5 mL Intramuscular Tomorrow-1000  . isosorbide mononitrate  30 mg Oral Daily  . [COMPLETED] lidocaine      . metoprolol tartrate  25 mg Oral BID  . [COMPLETED] midazolam      . [COMPLETED] nitroGLYCERIN      . rivastigmine  4.6 mg Transdermal Daily  . sodium chloride  3 mL  Intravenous Q12H  . [COMPLETED] verapamil      . [DISCONTINUED] metoprolol tartrate  12.5 mg Oral Q6H  . [DISCONTINUED] sodium chloride  3 mL Intravenous Q12H   Cardiac cath 02/04/12: Left main: No obstructive disease.  Left Anterior Descending Artery: Moderate caliber vessel in the proximal and mid segment with tapering to small caliber vessel in the mid and distal portion. The proximal vessel has diffuse 40% stenosis. The mid vessel has a focal 50% stenosis. The mid vessel appears to be a 2.0 mm vessel. The distal vessel is 1.5 mm in caliber and has mild plaque disease. The diagonal branch is moderate sized and has mild plaque disease.  Circumflex Artery: Moderate caliber vessel with a moderate caliber first obtuse marginal branch. Just beyond the takeoff of the marginal branch, the AV groove Circumflex becomes very small in caliber and has a 99% stenosis. This vessel is 1.5-1.7 mm in diameter.  Right Coronary Artery: Large dominant artery with mild proximal vessel plaque, serial 40% stenoses mid vessel and focal 50-60%  distal stenosis. The PDA and PLA have mild plaque disease.  Left Ventricular Angiogram: LVEF 55%.  Impression:  1. Unstable angina secondary to severe stenosis in very small caliber Circumflex lesion as above. This vessel is too small for PCI.  2. Moderate non-obstructive disease in the LAD and RCA.  3. Preserved LV systolic function   ASSESSMENT AND PLAN:  1. CAD/Unstable angina: Pt admitted on transfer from [facility] where he presented with chest pain and with mild elevation of troponin there, normal troponins here. Cardiac cath yesterday with moderate disease in major vessels and severe stenosis in a very small distal AV groove Circumflex artery, too small for PCI. Medical management will be pursued. Will continue ASA 81 mg po Qdaily and Clopidogrel 75 mg po Qdaily, Imdur 30 mg po Qdaily, Lipitor 80 mg po QHS, Lopressor 25 mg po BID.   2. Carotid artery disease: Will  get dopplers this am.   3. Dispo: d/c home today. He can follow up with me in 3 weeks in the office (or with Tereso Newcomer, PA-C)    Heron Pitcock  12/10/20137:25 AM

## 2012-02-11 ENCOUNTER — Telehealth: Payer: Self-pay | Admitting: Cardiovascular Disease

## 2012-02-11 NOTE — Telephone Encounter (Signed)
Spoke with pt's wife and reviewed carotid doppler results.

## 2012-02-11 NOTE — Telephone Encounter (Signed)
Pt spouse calling to get carotid results

## 2012-02-26 ENCOUNTER — Ambulatory Visit (INDEPENDENT_AMBULATORY_CARE_PROVIDER_SITE_OTHER): Payer: Medicare Other | Admitting: Physician Assistant

## 2012-02-26 ENCOUNTER — Encounter: Payer: Self-pay | Admitting: Physician Assistant

## 2012-02-26 VITALS — BP 118/80 | HR 52 | Ht 68.0 in | Wt 218.8 lb

## 2012-02-26 DIAGNOSIS — I10 Essential (primary) hypertension: Secondary | ICD-10-CM | POA: Diagnosis not present

## 2012-02-26 DIAGNOSIS — I2510 Atherosclerotic heart disease of native coronary artery without angina pectoris: Secondary | ICD-10-CM | POA: Diagnosis not present

## 2012-02-26 DIAGNOSIS — E782 Mixed hyperlipidemia: Secondary | ICD-10-CM

## 2012-02-26 DIAGNOSIS — I214 Non-ST elevation (NSTEMI) myocardial infarction: Secondary | ICD-10-CM

## 2012-02-26 NOTE — Progress Notes (Signed)
[address] [place], [place]  [address] Phone: [phone], Fax:  [phone]  Date:  02/26/2012   Name:  Paul Strickland   DOB:  05/12/1945   MRN:  [id number]  PCP:  Donzetta Sprung, MD  Primary Cardiologist:  Dr. Verne Carrow  Primary Electrophysiologist:  None    History of Present Illness: Paul Strickland is a 66 y.o. male who returns for follow up after recent admission to the hospital for NSTEMI.  He has a hx of DM2, HTN and early dementia.  He was admitted 12/8-12/10. Initially presented to [facility] with chest pain. Cardiac markers were abnormal. ECG demonstrated TWI in the anterior and lateral leads. He was transferred to [facility]. Of note, cardiac markers remained negative at [facility]. [facility] 02/04/12:  pLAD 40%, mLAD 50% (2 mm vessel), AV groove CFX 99% (very small-1.5-1.7 mm), RCA serial 40% mid, distal 50-60%, EF 55%. Medical management recommended. Culprit was likely a very small caliber mid AV groove circumflex lesion. This is too small for PCI. Echo 02/04/12: EF 50-55%, no wall motion abnormalities, grade 1 diastolic dysfunction, MAC, mild LAE, mild RVE with moderately reduced RV systolic function, mild RAE.  Carotid Dopplers 02/05/12: RICA less than 40%.  Since d/c, feels fatigued.  Otherwise, the patient denies chest pain, shortness of breath, syncope, orthopnea, PND or significant pedal edema.    Labs (12/13):    K 4.1, creatinine 1.05, LDL 83, Hgb 13.3, TSH 0.609  Wt Readings from Last 3 Encounters:  02/26/12 218 lb 12.8 oz (99.247 kg)  02/05/12 219 lb 2.2 oz (99.4 kg)  02/05/12 219 lb 2.2 oz (99.4 kg)     Past Medical History  Diagnosis Date  . HTN (hypertension)   . Diabetes mellitus type 2, diet-controlled   . Mixed dyslipidemia     Low HDL, elevated TRIG  . CAD (coronary artery disease)     a. admx with NSTEMI/USA 12/13 => tx from [place] (nl TnI at [facility]) - [facility] 02/04/12:  pLAD 40%, mLAD 50% (2 mm vessel), AV groove  CFX 99% (very small-1.5-1.7 mm), RCA serial 40% mid, distal 50-60%, EF 55%. => med Rx.  Marland Kitchen Hx of echocardiogram     a. Echo 02/04/12: EF 50-55%, no wall motion abnormalities, grade 1 diastolic dysfunction, MAC, mild LAE, mild RVE with moderately reduced RV systolic function, mild RAE  . Carotid stenosis     a. dopplers 12/13: RICA < 40%    Current Outpatient Prescriptions  Medication Sig Dispense Refill  . aspirin 81 MG tablet Take 1 tablet (81 mg total) by mouth daily.  30 tablet    . atorvastatin (LIPITOR) 80 MG tablet Take 1 tablet (80 mg total) by mouth daily.  30 tablet  11  . clopidogrel (PLAVIX) 75 MG tablet Take 1 tablet (75 mg total) by mouth daily.  30 tablet  11  . isosorbide mononitrate (IMDUR) 30 MG 24 hr tablet Take 1 tablet (30 mg total) by mouth daily.  30 tablet  11  . metoprolol tartrate (LOPRESSOR) 25 MG tablet Take 1 tablet (25 mg total) by mouth 2 (two) times daily.  60 tablet  11  . nitroGLYCERIN (NITROSTAT) 0.4 MG SL tablet Place 1 tablet (0.4 mg total) under the tongue every 5 (five) minutes as needed for chest pain.  25 tablet  3  . Rivastigmine 13.3 MG/24HR PT24 Place 1 patch onto the skin daily.        Allergies: Allergies  Allergen  Reactions  . Penicillins Rash    Social History:  The patient  reports that he quit smoking about 45 years ago. His smoking use included Cigarettes. He has never used smokeless tobacco.   ROS:  Please see the history of present illness.    All other systems reviewed and negative.   PHYSICAL EXAM: VS:  BP 118/80  Pulse 52  Ht 5\' 8"  (1.727 m)  Wt 218 lb 12.8 oz (99.247 kg)  BMI 33.27 kg/m2 Well nourished, well developed, in no acute distress HEENT: normal Neck: no JVD Cardiac:  normal S1, S2; RRR; no murmur Lungs:  clear to auscultation bilaterally, no wheezing, rhonchi or rales Abd: soft, nontender, no hepatomegaly Ext: no edema; right wrist without hematoma or bruit  Skin: warm and dry Neuro:  CNs 2-12 intact, no focal  abnormalities noted  EKG:  Sinus brady, HR 52, NSSTTW changes, PRWP     ASSESSMENT AND PLAN:  1. Coronary Artery Disease:   Patient doing well since presentation with ACS.  Tolerating medical Rx well.  Does feel fatigued and he is bradycardic.  Decrease Metoprolol to 12.5 mg bid.  Continue ASA, Plavix, statin, nitrate.  He is getting set up with cardiac rehab at [facility].  2. Hyperlipidemia:  Continue statin.  Check Lipids and LFTs in 8 weeks.    3. Hypertension:   Controlled.  Continue current therapy.  4. Carotid Stenosis:  Mild plaque.  Will likely need f/u dopplers in 1-2 years.   5. Dementia:  Per PCP.  6. Disposition:   Follow up with Dr. Verne Carrow in 3 mos.  Signed, Tereso Newcomer, PA-C  11:40 AM 02/26/2012

## 2012-02-26 NOTE — Patient Instructions (Addendum)
DECREASE Metoprolol to 12.5 mg Twice Daily  Have your PCP Draw labs (Fasting LIPIDS/ Liver Panel Functions) and Fax to [phone]

## 2012-02-28 DIAGNOSIS — E782 Mixed hyperlipidemia: Secondary | ICD-10-CM | POA: Diagnosis not present

## 2012-02-28 DIAGNOSIS — N200 Calculus of kidney: Secondary | ICD-10-CM | POA: Diagnosis not present

## 2012-02-28 DIAGNOSIS — G4733 Obstructive sleep apnea (adult) (pediatric): Secondary | ICD-10-CM | POA: Diagnosis not present

## 2012-02-28 DIAGNOSIS — F0390 Unspecified dementia without behavioral disturbance: Secondary | ICD-10-CM | POA: Diagnosis not present

## 2012-02-28 DIAGNOSIS — I10 Essential (primary) hypertension: Secondary | ICD-10-CM | POA: Diagnosis not present

## 2012-05-13 ENCOUNTER — Other Ambulatory Visit: Payer: Self-pay | Admitting: *Deleted

## 2012-05-13 DIAGNOSIS — I2510 Atherosclerotic heart disease of native coronary artery without angina pectoris: Secondary | ICD-10-CM

## 2012-05-13 MED ORDER — METOPROLOL TARTRATE 25 MG PO TABS: 12.5000 mg | ORAL_TABLET | Freq: Two times a day (BID) | ORAL | Status: DC

## 2012-05-19 DIAGNOSIS — E782 Mixed hyperlipidemia: Secondary | ICD-10-CM | POA: Diagnosis not present

## 2012-05-19 DIAGNOSIS — E669 Obesity, unspecified: Secondary | ICD-10-CM | POA: Diagnosis not present

## 2012-05-23 ENCOUNTER — Ambulatory Visit: Payer: Medicare Other | Admitting: Cardiovascular Disease

## 2012-05-27 DIAGNOSIS — Z85828 Personal history of other malignant neoplasm of skin: Secondary | ICD-10-CM | POA: Diagnosis not present

## 2012-05-27 DIAGNOSIS — L570 Actinic keratosis: Secondary | ICD-10-CM | POA: Diagnosis not present

## 2012-06-10 DIAGNOSIS — N200 Calculus of kidney: Secondary | ICD-10-CM | POA: Diagnosis not present

## 2012-06-10 DIAGNOSIS — I10 Essential (primary) hypertension: Secondary | ICD-10-CM | POA: Diagnosis not present

## 2012-06-10 DIAGNOSIS — E782 Mixed hyperlipidemia: Secondary | ICD-10-CM | POA: Diagnosis not present

## 2012-06-10 DIAGNOSIS — G4733 Obstructive sleep apnea (adult) (pediatric): Secondary | ICD-10-CM | POA: Diagnosis not present

## 2012-06-10 DIAGNOSIS — F0390 Unspecified dementia without behavioral disturbance: Secondary | ICD-10-CM | POA: Diagnosis not present

## 2012-06-24 ENCOUNTER — Ambulatory Visit (INDEPENDENT_AMBULATORY_CARE_PROVIDER_SITE_OTHER): Payer: No Typology Code available for payment source | Admitting: Cardiovascular Disease

## 2012-06-24 ENCOUNTER — Encounter: Payer: Self-pay | Admitting: Cardiovascular Disease

## 2012-06-24 VITALS — BP 122/76 | HR 62 | Ht 68.0 in | Wt 211.6 lb

## 2012-06-24 DIAGNOSIS — I2510 Atherosclerotic heart disease of native coronary artery without angina pectoris: Secondary | ICD-10-CM

## 2012-06-24 DIAGNOSIS — E785 Hyperlipidemia, unspecified: Secondary | ICD-10-CM

## 2012-06-24 DIAGNOSIS — I779 Disorder of arteries and arterioles, unspecified: Secondary | ICD-10-CM | POA: Diagnosis not present

## 2012-06-24 DIAGNOSIS — I10 Essential (primary) hypertension: Secondary | ICD-10-CM

## 2012-06-24 NOTE — Patient Instructions (Signed)
Your physician wants you to follow-up in:  6 months.  You will receive a reminder letter in the mail two months in advance. If you don't receive a letter, please call our office to schedule the follow-up appointment.  Your physician has recommended you make the following change in your medication:  Stop metoprolol   

## 2012-06-24 NOTE — Progress Notes (Signed)
History of Present Illness: 67 y.o. male who returns for follow up after recent admission to the hospital for NSTEMI.  He has a hx of DM2, HTN and early dementia. He was admitted 12/8-12/10/13. Initially presented to [facility] with chest pain. Cardiac markers were abnormal. ECG demonstrated TWI in the anterior and lateral leads. He was transferred to [facility]. Of note, cardiac markers remained negative at [facility]. [facility] 02/04/12: pLAD 40%, mLAD 50% (2 mm vessel), AV groove CFX 99% (very small-1.5-1.7 mm), RCA serial 40% mid, distal 50-60%, EF 55%. Medical management recommended. Culprit was likely a very small caliber mid AV groove circumflex lesion. This is too small for PCI. Echo 02/04/12: EF 50-55%, no wall motion abnormalities, grade 1 diastolic dysfunction, MAC, mild LAE, mild RVE with moderately reduced RV systolic function, mild RAE. Carotid Dopplers 02/05/12: RICA less than 40%.   He is here today for follow up. He still feels fatigued. Otherwise, the patient denies chest pain, shortness of breath, syncope, orthopnea, PND or significant pedal edema.   Primary Care Physician: Donzetta Sprung  Last Lipid Profile: Followed in primary care. Total chol 132 per wife 06/09/12.    Past Medical History  Diagnosis Date  . HTN (hypertension)   . Diabetes mellitus type 2, diet-controlled   . Mixed dyslipidemia     Low HDL, elevated TRIG  . CAD (coronary artery disease)     a. admx with NSTEMI/USA 12/13 => tx from [place] (nl TnI at [facility]) - [facility] 02/04/12:  pLAD 40%, mLAD 50% (2 mm vessel), AV groove CFX 99% (very small-1.5-1.7 mm), RCA serial 40% mid, distal 50-60%, EF 55%. => med Rx.  Marland Kitchen Hx of echocardiogram     a. Echo 02/04/12: EF 50-55%, no wall motion abnormalities, grade 1 diastolic dysfunction, MAC, mild LAE, mild RVE with moderately reduced RV systolic function, mild RAE  . Carotid stenosis     a. dopplers 12/13: RICA < 40%    No past surgical history on file.  Current Outpatient  Prescriptions  Medication Sig Dispense Refill  . aspirin 81 MG tablet Take 1 tablet (81 mg total) by mouth daily.  30 tablet    . atorvastatin (LIPITOR) 80 MG tablet Take 1 tablet (80 mg total) by mouth daily.  30 tablet  11  . clopidogrel (PLAVIX) 75 MG tablet Take 1 tablet (75 mg total) by mouth daily.  30 tablet  11  . isosorbide mononitrate (IMDUR) 30 MG 24 hr tablet Take 1 tablet (30 mg total) by mouth daily.  30 tablet  11  . metoprolol tartrate (LOPRESSOR) 25 MG tablet Take 0.5 tablets (12.5 mg total) by mouth 2 (two) times daily.  30 tablet  6  . nitroGLYCERIN (NITROSTAT) 0.4 MG SL tablet Place 1 tablet (0.4 mg total) under the tongue every 5 (five) minutes as needed for chest pain.  25 tablet  3  . Rivastigmine 13.3 MG/24HR PT24 Place 1 patch onto the skin daily.       No current facility-administered medications for this visit.    Allergies  Allergen Reactions  . Penicillins Rash    History   Social History  . Marital Status: Married    Spouse Name: N/A    Number of Children: N/A  . Years of Education: N/A   Occupational History  . Not on file.   Social History Main Topics  . Smoking status: Former Smoker    Types: Cigarettes    Quit date: 02/03/1967  . Smokeless tobacco:  Never Used  . Alcohol Use:   . Drug Use:   . Sexually Active:    Other Topics Concern  . Not on file   Social History Narrative  . No narrative on file    No family history on file.  Review of Systems:  As stated in the HPI and otherwise negative.   BP 122/76  Pulse 62  Ht 5\' 8"  (1.727 m)  Wt 211 lb 9.6 oz (95.981 kg)  BMI 32.18 kg/m2  Physical Examination: General: Well developed, well nourished, NAD HEENT: OP clear, mucus membranes moist SKIN: warm, dry. No rashes. Neuro: No focal deficits Musculoskeletal: Muscle strength 5/5 all ext Psychiatric: Mood and affect normal Neck: No JVD, no carotid bruits, no thyromegaly, no lymphadenopathy. Lungs:Clear bilaterally, no wheezes,  rhonci, crackles Cardiovascular: Regular rate and rhythm. No murmurs, gallops or rubs. Abdomen:Soft. Bowel sounds present. Non-tender.  Extremities: No lower extremity edema. Pulses are 2 + in the bilateral DP/PT.  Assessment and Plan:   1. Coronary Artery Disease: Patient doing well since presentation with ACS. Tolerating medical Rx well. Does feel fatigued and he is bradycardic. Will d/c metoprolol.  Continue ASA, Plavix, statin, nitrate. He declined cardiac rehab.  2. Hyperlipidemia: Continue statin. Lipids followed in primary care  3. Hypertension: Controlled. Continue current therapy.   4. Carotid Stenosis: Mild plaque. Will likely need f/u dopplers in 2015.    5. Dementia: Per PCP.

## 2012-08-18 DIAGNOSIS — L03319 Cellulitis of trunk, unspecified: Secondary | ICD-10-CM | POA: Diagnosis not present

## 2012-08-18 DIAGNOSIS — L0291 Cutaneous abscess, unspecified: Secondary | ICD-10-CM | POA: Diagnosis not present

## 2012-08-18 DIAGNOSIS — L0390 Cellulitis, unspecified: Secondary | ICD-10-CM | POA: Diagnosis not present

## 2012-08-18 DIAGNOSIS — F0390 Unspecified dementia without behavioral disturbance: Secondary | ICD-10-CM | POA: Diagnosis not present

## 2012-08-18 DIAGNOSIS — I10 Essential (primary) hypertension: Secondary | ICD-10-CM | POA: Diagnosis not present

## 2012-08-18 DIAGNOSIS — L02219 Cutaneous abscess of trunk, unspecified: Secondary | ICD-10-CM | POA: Diagnosis not present

## 2012-08-18 DIAGNOSIS — N200 Calculus of kidney: Secondary | ICD-10-CM | POA: Diagnosis not present

## 2012-08-18 DIAGNOSIS — G4733 Obstructive sleep apnea (adult) (pediatric): Secondary | ICD-10-CM | POA: Diagnosis not present

## 2012-08-18 DIAGNOSIS — E782 Mixed hyperlipidemia: Secondary | ICD-10-CM | POA: Diagnosis not present

## 2012-10-10 ENCOUNTER — Telehealth: Payer: Self-pay | Admitting: Cardiovascular Disease

## 2012-10-10 NOTE — Telephone Encounter (Signed)
New problem    Pt having chest pains/ takes nitro and 1/2 hr later pains come back- needs to know if he needs to go to ER-pt is not having any other symptoms besides sob

## 2012-10-10 NOTE — Telephone Encounter (Signed)
Agree. cdm 

## 2012-10-10 NOTE — Telephone Encounter (Signed)
Spoke with pt's wife. She reports pt has been having chest tightness and shortness of breath. Started last night. He will use NTG and pain will be relieved but returns in half an hour. This happened 5 times during the night and has happened 3 times since 5:30 this morning.  I instructed pt's wife to call EMS and have pt transported to hospital.  Wife agreeable with this plan.

## 2012-10-14 ENCOUNTER — Emergency Department (HOSPITAL_COMMUNITY): Payer: Medicare Other

## 2012-10-14 ENCOUNTER — Inpatient Hospital Stay (HOSPITAL_COMMUNITY)
Admission: EM | Admit: 2012-10-14 | Discharge: 2012-10-15 | DRG: 247 | Disposition: A | Discharge status: Home / Self Care | Payer: Medicare Other | Attending: Cardiovascular Disease | Admitting: Cardiovascular Disease

## 2012-10-14 ENCOUNTER — Encounter (HOSPITAL_COMMUNITY): Payer: Self-pay | Admitting: Nurse Practitioner

## 2012-10-14 ENCOUNTER — Encounter (HOSPITAL_COMMUNITY)
Admission: EM | Disposition: A | Discharge status: Home / Self Care | Payer: Self-pay | Source: Home / Self Care | Attending: Cardiovascular Disease

## 2012-10-14 DIAGNOSIS — E782 Mixed hyperlipidemia: Secondary | ICD-10-CM | POA: Diagnosis not present

## 2012-10-14 DIAGNOSIS — I498 Other specified cardiac arrhythmias: Secondary | ICD-10-CM | POA: Diagnosis present

## 2012-10-14 DIAGNOSIS — Z7902 Long term (current) use of antithrombotics/antiplatelets: Secondary | ICD-10-CM | POA: Diagnosis not present

## 2012-10-14 DIAGNOSIS — I252 Old myocardial infarction: Secondary | ICD-10-CM | POA: Diagnosis not present

## 2012-10-14 DIAGNOSIS — G4730 Sleep apnea, unspecified: Secondary | ICD-10-CM | POA: Diagnosis present

## 2012-10-14 DIAGNOSIS — I214 Non-ST elevation (NSTEMI) myocardial infarction: Secondary | ICD-10-CM | POA: Diagnosis not present

## 2012-10-14 DIAGNOSIS — Z7982 Long term (current) use of aspirin: Secondary | ICD-10-CM | POA: Diagnosis not present

## 2012-10-14 DIAGNOSIS — E119 Type 2 diabetes mellitus without complications: Secondary | ICD-10-CM | POA: Diagnosis present

## 2012-10-14 DIAGNOSIS — Z8249 Family history of ischemic heart disease and other diseases of the circulatory system: Secondary | ICD-10-CM | POA: Diagnosis not present

## 2012-10-14 DIAGNOSIS — D72829 Elevated white blood cell count, unspecified: Secondary | ICD-10-CM | POA: Diagnosis present

## 2012-10-14 DIAGNOSIS — F0390 Unspecified dementia without behavioral disturbance: Secondary | ICD-10-CM | POA: Diagnosis present

## 2012-10-14 DIAGNOSIS — Z79899 Other long term (current) drug therapy: Secondary | ICD-10-CM

## 2012-10-14 DIAGNOSIS — Z880 Allergy status to penicillin: Secondary | ICD-10-CM | POA: Diagnosis not present

## 2012-10-14 DIAGNOSIS — Z87891 Personal history of nicotine dependence: Secondary | ICD-10-CM

## 2012-10-14 DIAGNOSIS — I10 Essential (primary) hypertension: Secondary | ICD-10-CM | POA: Diagnosis present

## 2012-10-14 DIAGNOSIS — Z85828 Personal history of other malignant neoplasm of skin: Secondary | ICD-10-CM | POA: Diagnosis not present

## 2012-10-14 DIAGNOSIS — Z955 Presence of coronary angioplasty implant and graft: Secondary | ICD-10-CM

## 2012-10-14 DIAGNOSIS — R079 Chest pain, unspecified: Secondary | ICD-10-CM | POA: Diagnosis not present

## 2012-10-14 DIAGNOSIS — I6529 Occlusion and stenosis of unspecified carotid artery: Secondary | ICD-10-CM | POA: Diagnosis present

## 2012-10-14 DIAGNOSIS — I2510 Atherosclerotic heart disease of native coronary artery without angina pectoris: Secondary | ICD-10-CM | POA: Diagnosis present

## 2012-10-14 HISTORY — DX: Unspecified dementia, unspecified severity, without behavioral disturbance, psychotic disturbance, mood disturbance, and anxiety: F03.90

## 2012-10-14 HISTORY — PX: LEFT HEART CATHETERIZATION WITH CORONARY ANGIOGRAM: SHX5451

## 2012-10-14 HISTORY — PX: PERCUTANEOUS CORONARY STENT INTERVENTION (PCI-S): SHX5485

## 2012-10-14 HISTORY — DX: Malignant melanoma of skin, unspecified: C43.9

## 2012-10-14 HISTORY — DX: Disorder of kidney and ureter, unspecified: N28.9

## 2012-10-14 HISTORY — DX: Sleep apnea, unspecified: G47.30

## 2012-10-14 HISTORY — DX: Calculus of kidney: N20.0

## 2012-10-14 HISTORY — DX: Elevated white blood cell count, unspecified: D72.829

## 2012-10-14 HISTORY — DX: Bradycardia, unspecified: R00.1

## 2012-10-14 HISTORY — DX: Unspecified malignant neoplasm of skin, unspecified: C44.90

## 2012-10-14 HISTORY — PX: CORONARY ANGIOPLASTY WITH STENT PLACEMENT: SHX49

## 2012-10-14 LAB — POCT ACTIVATED CLOTTING TIME
Activated Clotting Time: 186 seconds
Activated Clotting Time: 426 seconds

## 2012-10-14 LAB — CBC
HCT: 43.9 % (ref 39.0–52.0)
HCT: 44.8 % (ref 39.0–52.0)
Hemoglobin: 16.3 g/dL (ref 13.0–17.0)
MCH: 30.5 pg (ref 26.0–34.0)
MCHC: 36.4 g/dL — ABNORMAL HIGH (ref 30.0–36.0)
MCV: 83.6 fL (ref 78.0–100.0)
MCV: 83.1 fL (ref 78.0–100.0)
RDW: 12.9 % (ref 11.5–15.5)
WBC: 13.2 10*3/uL — ABNORMAL HIGH (ref 4.0–10.5)

## 2012-10-14 LAB — BASIC METABOLIC PANEL
BUN: 14 mg/dL (ref 6–23)
CO2: 26 mEq/L (ref 19–32)
Calcium: 9.5 mg/dL (ref 8.4–10.5)
Creatinine, Ser: 1.05 mg/dL (ref 0.50–1.35)
Glucose, Bld: 170 mg/dL — ABNORMAL HIGH (ref 70–99)
Sodium: 138 mEq/L (ref 135–145)

## 2012-10-14 LAB — HEPATIC FUNCTION PANEL
Albumin: 4 g/dL (ref 3.5–5.2)
Alkaline Phosphatase: 52 U/L (ref 39–117)
Bilirubin, Direct: 0.1 mg/dL (ref 0.0–0.3)
Total Bilirubin: 0.7 mg/dL (ref 0.3–1.2)

## 2012-10-14 LAB — TROPONIN I: Troponin I: 6.37 ng/mL (ref <0.30)

## 2012-10-14 LAB — MRSA PCR SCREENING: MRSA by PCR: NEGATIVE

## 2012-10-14 LAB — GLUCOSE, CAPILLARY
Glucose-Capillary: 109 mg/dL — ABNORMAL HIGH (ref 70–99)
Glucose-Capillary: 191 mg/dL — ABNORMAL HIGH (ref 70–99)

## 2012-10-14 LAB — POCT I-STAT TROPONIN I

## 2012-10-14 LAB — PROTIME-INR: INR: 1.08 (ref 0.00–1.49)

## 2012-10-14 SURGERY — LEFT HEART CATHETERIZATION WITH CORONARY ANGIOGRAM
Anesthesia: LOCAL | Site: Hand | Laterality: Right

## 2012-10-14 MED ORDER — NITROGLYCERIN 0.2 MG/ML ON CALL CATH LAB
INTRAVENOUS | Status: AC
  Filled 2012-10-14: qty 1

## 2012-10-14 MED ORDER — MIDAZOLAM HCL 2 MG/2ML IJ SOLN
INTRAMUSCULAR | Status: AC
  Filled 2012-10-14: qty 2

## 2012-10-14 MED ORDER — HEPARIN (PORCINE) IN NACL 100-0.45 UNIT/ML-% IJ SOLN
1200.0000 [IU]/h | INTRAMUSCULAR | Status: DC
  Administered 2012-10-14: 1200 [IU]/h via INTRAVENOUS
  Filled 2012-10-14: qty 250

## 2012-10-14 MED ORDER — CLOPIDOGREL BISULFATE 75 MG PO TABS
75.0000 mg | ORAL_TABLET | Freq: Every day | ORAL | Status: DC
  Administered 2012-10-15: 75 mg via ORAL
  Filled 2012-10-14: qty 1

## 2012-10-14 MED ORDER — FENTANYL CITRATE 0.05 MG/ML IJ SOLN
INTRAMUSCULAR | Status: AC
  Filled 2012-10-14: qty 2

## 2012-10-14 MED ORDER — HEPARIN BOLUS VIA INFUSION
4000.0000 [IU] | Freq: Once | INTRAVENOUS | Status: AC
  Administered 2012-10-14: 4000 [IU] via INTRAVENOUS

## 2012-10-14 MED ORDER — VERAPAMIL HCL 2.5 MG/ML IV SOLN
INTRAVENOUS | Status: AC
  Filled 2012-10-14: qty 2

## 2012-10-14 MED ORDER — INSULIN ASPART 100 UNIT/ML ~~LOC~~ SOLN
0.0000 [IU] | Freq: Three times a day (TID) | SUBCUTANEOUS | Status: DC
  Administered 2012-10-15: 09:00:00 2 [IU] via SUBCUTANEOUS

## 2012-10-14 MED ORDER — LOSARTAN POTASSIUM 25 MG PO TABS
25.0000 mg | ORAL_TABLET | Freq: Every day | ORAL | Status: DC
  Administered 2012-10-14 – 2012-10-15 (×2): 25 mg via ORAL
  Filled 2012-10-14 (×2): qty 1

## 2012-10-14 MED ORDER — SODIUM CHLORIDE 0.9 % IV SOLN: 1.0000 mL/kg/h | INTRAVENOUS | Status: AC

## 2012-10-14 MED ORDER — HEPARIN SODIUM (PORCINE) 1000 UNIT/ML IJ SOLN
INTRAMUSCULAR | Status: AC
  Filled 2012-10-14: qty 1

## 2012-10-14 MED ORDER — SODIUM CHLORIDE 0.9 % IV SOLN
INTRAVENOUS | Status: DC
  Administered 2012-10-14: 13:00:00 via INTRAVENOUS

## 2012-10-14 MED ORDER — ISOSORBIDE MONONITRATE ER 30 MG PO TB24
30.0000 mg | ORAL_TABLET | Freq: Every day | ORAL | Status: DC
  Administered 2012-10-15: 09:00:00 30 mg via ORAL
  Filled 2012-10-14: qty 1

## 2012-10-14 MED ORDER — NITROGLYCERIN 0.4 MG SL SUBL: 0.4000 mg | SUBLINGUAL_TABLET | SUBLINGUAL | Status: DC | PRN

## 2012-10-14 MED ORDER — ATORVASTATIN CALCIUM 80 MG PO TABS
80.0000 mg | ORAL_TABLET | Freq: Every day | ORAL | Status: DC
  Administered 2012-10-15: 09:00:00 80 mg via ORAL
  Filled 2012-10-14: qty 1

## 2012-10-14 MED ORDER — HEPARIN (PORCINE) IN NACL 2-0.9 UNIT/ML-% IJ SOLN
INTRAMUSCULAR | Status: AC
  Filled 2012-10-14: qty 1000

## 2012-10-14 MED ORDER — ASPIRIN 81 MG PO TBEC
81.0000 mg | DELAYED_RELEASE_TABLET | Freq: Every day | ORAL | Status: DC
  Administered 2012-10-15: 09:00:00 81 mg via ORAL
  Filled 2012-10-14: qty 1

## 2012-10-14 MED ORDER — ACETAMINOPHEN 325 MG PO TABS: 650.0000 mg | ORAL_TABLET | ORAL | Status: DC | PRN

## 2012-10-14 MED ORDER — LIDOCAINE HCL (PF) 1 % IJ SOLN
INTRAMUSCULAR | Status: AC
  Filled 2012-10-14: qty 30

## 2012-10-14 MED ORDER — RIVASTIGMINE 13.3 MG/24HR TD PT24
1.0000 | MEDICATED_PATCH | Freq: Every day | TRANSDERMAL | Status: DC
  Administered 2012-10-15: 13.3 mg via TRANSDERMAL
  Filled 2012-10-14: qty 1

## 2012-10-14 MED ORDER — ONDANSETRON HCL 4 MG/2ML IJ SOLN: 4.0000 mg | Freq: Four times a day (QID) | INTRAMUSCULAR | Status: DC | PRN

## 2012-10-14 NOTE — ED Notes (Signed)
CBC tube was clotted - lavendar tube.  Need new order and redraw per lab.

## 2012-10-14 NOTE — Progress Notes (Signed)
ANTICOAGULATION CONSULT NOTE - Initial Consult  Pharmacy Consult for Heparin Indication: chest pain/ACS  Allergies  Allergen Reactions  . Penicillins Rash    Patient Measurements: Height: 5\' 8"  (172.7 cm) Weight: 200 lb (90.719 kg) IBW/kg (Calculated) : 68.4 Heparin Dosing Weight: 87 kg  Vital Signs: Temp: 98.1 F (36.7 C) (08/19 0933) Temp src: Oral (08/19 0933) BP: 118/98 mmHg (08/19 1255) Pulse Rate: 58 (08/19 1255)  Labs:  Recent Labs  10/14/12 0947 10/14/12 1040 10/14/12 1200  HGB  --  16.0 16.3  HCT  --  43.9 44.8  PLT  --  293 339  CREATININE 1.05  --   --     Estimated Creatinine Clearance: 74.6 ml/min (by C-G formula based on Cr of 1.05).   Medical History: Past Medical History  Diagnosis Date  . HTN (hypertension)   . Diabetes mellitus type 2, diet-controlled   . Mixed dyslipidemia     Low HDL, elevated TRIG  . CAD (coronary artery disease)     a. admx with NSTEMI/USA 12/13 => tx from [place] (nl T[facility]I at [facility]) - [facility] 02/04/12:  pLAD 40%, mLAD 50% (2 mm vessel), AV groove CFX 99% (very small-1.5-1.7 mm), RCA serial 40% mid, distal 50-60%, EF 55%. => med Rx.  Marland Kitchen Hx of echocardiogram     a. Echo 02/04/12: EF 50-55%, no wall motion abnormalities, grade 1 diastolic dysfunction, MAC, mild LAE, mild RVE with moderately reduced RV systolic function, mild RAE  . Carotid stenosis     a. dopplers 12/13: RICA < 40%  . Dementia     a. Early dementia, on Exelon. Mother had early onset dementia.  . Acute renal insufficiency     a. Cr 1.48 in 01/2012, with resolution.  . Skin cancer     a. Excised from back in 2012.  . Melanoma     a. Exercised from head in 2012, did not spread.  . Bradycardia     a. HR 60's, fatigue in 05/2012 - beta blocker discontinued.    Medications:  See electronic med rec  Assessment: 67 y.o. male presents with chest pain and elevated troponin (0.57). Pt with h/o CAD (NSTEMI 2013, moderate dz LAD/RCA - treated with med rx). To begin  heparin for ACS. CBC stable at baseline. Noted plan to take pt to cath today.  Goal of Therapy:  Heparin level 0.3-0.7 units/ml Monitor platelets by anticoagulation protocol: Yes   Plan:  1. Heparin 4000 unit IV bolus 2. Heparin gtt at 1200 units/hr 3. Will f/u post cath or 6 hr heparin level 4. Daily HL and CBC  Christoper Fabian, PharmD, BCPS Clinical pharmacist, pager [phone] 10/14/2012,1:04 PM

## 2012-10-14 NOTE — ED Notes (Signed)
Cardiology at bedside for evaluation.

## 2012-10-14 NOTE — Interval H&P Note (Signed)
History and Physical Interval Note:  10/14/2012 2:43 PM  Fidela Juneau  has presented today for surgery, with the diagnosis of Chest pain  The various methods of treatment have been discussed with the patient and family. After consideration of risks, benefits and other options for treatment, the patient has consented to  Procedure(s): LEFT HEART CATHETERIZATION WITH CORONARY ANGIOGRAM (N/A) as a surgical intervention .  The patient's history has been reviewed, patient examined, no change in status, stable for surgery.  I have reviewed the patient's chart and labs.  Questions were answered to the patient's satisfaction.    Cath Lab Visit (complete for each Cath Lab visit)  Clinical Evaluation Leading to the Procedure:   ACS: yes  Non-ACS:    Anginal Classification: CCS III  Anti-ischemic medical therapy: Minimal Therapy (1 class of medications)  Non-Invasive Test Results: No non-invasive testing performed  Prior CABG: No previous CABG       Theron Arista [facility] 10/14/2012 2:44 PM

## 2012-10-14 NOTE — ED Provider Notes (Signed)
CSN: [id number]     Arrival date & time 10/14/12  5621 History     First MD Initiated Contact with Patient 10/14/12 1022     Chief Complaint  Patient presents with  . Chest Pain   (Consider location/radiation/quality/duration/timing/severity/associated sxs/prior Treatment) HPI Comments: Paul Strickland is a 67 y.o. Male who states that he had chest pain during the night, that awoke him from sleep, but then resolved enough to allow him to sleep, and had recurrence of the chest pain, upon awakening, and that the chest pain, resolved after arrival in the emergency department. He took 2 aspirin and feels like that helped. He cannot specify other concerns or events, and appears to be a poor historian.   11:15- patient's wife arrived and was able to give additional history. The patient has been taking nitroglycerin intermittently for 2 weeks, at least 15, when he has episodes of chest pain and dyspnea on exertion. Rest seems to help, as does taking the nitroglycerin. Last night, at 11 PM, he took a nitroglycerin and went to bed. Subsequent to that at about 2 AM he had chest pain got up and took 6 aspirin tablets, and a nitroglycerin tablet. He then went back to bed. 4 days ago, his cardiologist, was contacted, and recommended an ER evaluation. The patient did not come in then, because, "he is hard-headed."  Patient is a 67 y.o. male presenting with chest pain. The history is provided by the patient.  Chest Pain   Past Medical History  Diagnosis Date  . HTN (hypertension)   . Diabetes mellitus type 2, diet-controlled   . Mixed dyslipidemia     Low HDL, elevated TRIG  . CAD (coronary artery disease)     a. admx with NSTEMI/USA 12/13 => tx from [place] (nl TnI at [facility]) - [facility] 02/04/12:  pLAD 40%, mLAD 50% (2 mm vessel), AV groove CFX 99% (very small-1.5-1.7 mm), RCA serial 40% mid, distal 50-60%, EF 55%. => med Rx.  Marland Kitchen Hx of echocardiogram     a. Echo 02/04/12: EF 50-55%, no wall motion abnormalities,  grade 1 diastolic dysfunction, MAC, mild LAE, mild RVE with moderately reduced RV systolic function, mild RAE  . Carotid stenosis     a. dopplers 12/13: RICA < 40%  . Dementia     a. Early dementia, on Exelon. Mother had early onset dementia.  . Acute renal insufficiency     a. Cr 1.48 in 01/2012, with resolution.  . Skin cancer     a. Excised from back in 2012.  . Melanoma     a. Exercised from head in 2012, did not spread.  . Bradycardia     a. HR 60's, fatigue in 05/2012 - beta blocker discontinued.   Past Surgical History  Procedure Laterality Date  . Back surgery     Family History  Problem Relation Age of Onset  . Dementia Mother     Early onset  . CAD Father     MI at 27   History  Substance Use Topics  . Smoking status: Former Smoker    Types: Cigarettes    Quit date: 02/03/1967  . Smokeless tobacco: Never Used     Comment: As a teen occasionally  . Alcohol Use: No    Review of Systems  Cardiovascular: Positive for chest pain.  All other systems reviewed and are negative.    Allergies  Penicillins  Home Medications   No current outpatient prescriptions on file. BP 179/133  Pulse 71  Temp(Src) 98.1 F (36.7 C) (Oral)  Resp 16  Ht 5\' 8"  (1.727 m)  Wt 200 lb (90.719 kg)  BMI 30.42 kg/m2  SpO2 93% Physical Exam  Nursing note and vitals reviewed. Constitutional: He is oriented to person, place, and time. He appears well-developed.  Elderly  HENT:  Head: Normocephalic and atraumatic.  Right Ear: External ear normal.  Left Ear: External ear normal.  Eyes: Conjunctivae and EOM are normal. Pupils are equal, round, and reactive to light.  Neck: Normal range of motion and phonation normal. Neck supple.  Cardiovascular: Normal rate, regular rhythm, normal heart sounds and intact distal pulses.   Pulmonary/Chest: Effort normal and breath sounds normal. He exhibits no bony tenderness.  Abdominal: Soft. Normal appearance. There is no tenderness.   Musculoskeletal: Normal range of motion.  Neurological: He is alert and oriented to person, place, and time. He has normal strength. No cranial nerve deficit or sensory deficit. He exhibits normal muscle tone. Coordination normal.  Skin: Skin is warm, dry and intact.  Psychiatric: He has a normal mood and affect. His behavior is normal. Judgment and thought content normal.    ED Course   Procedures (including critical care time)  Initial troponin positive- cardiology consultation 11:22  11:22 AM-Consult complete with [facility]. Patient case explained and discussed. They agrees to admit patient for further evaluation and treatment. Call ended at 1130    Date: 61  Rate: 61  Rhythm: normal sinus rhythm  QRS Axis: normal  PR and QT Intervals: normal  ST/T Wave abnormalities: nonspecific T wave changes  PR and QRS Conduction Disutrbances:none  Narrative Interpretation:   Old EKG Reviewed: unchanged- 02/05/12   Labs Reviewed  BASIC METABOLIC PANEL - Abnormal; Notable for the following:    Glucose, Bld 170 (*)    GFR calc non Af Amer 71 (*)    GFR calc Af Amer 83 (*)    All other components within normal limits  PRO B NATRIURETIC PEPTIDE - Abnormal; Notable for the following:    Pro B Natriuretic peptide (BNP) 2925.0 (*)    All other components within normal limits  CBC - Abnormal; Notable for the following:    WBC 13.2 (*)    MCHC 36.4 (*)    All other components within normal limits  CBC - Abnormal; Notable for the following:    WBC 15.1 (*)    MCHC 36.4 (*)    All other components within normal limits  GLUCOSE, CAPILLARY - Abnormal; Notable for the following:    Glucose-Capillary 109 (*)    All other components within normal limits  POCT I-STAT TROPONIN I - Abnormal; Notable for the following:    Troponin i, poc 0.57 (*)    All other components within normal limits  MRSA PCR SCREENING  PROTIME-INR  PLATELET INHIBITION P2Y12  TROPONIN I  TROPONIN I   TROPONIN I  TSH  HEPATIC FUNCTION PANEL   Dg Chest 2 View  10/14/2012   *RADIOLOGY REPORT*  Clinical Data: Chest pain  CHEST - 2 VIEW  Comparison: None  Findings: Heart size and vascularity are normal.  Negative for heart failure.  Negative for infiltrate effusion or mass.  Negative for pneumonia.  IMPRESSION: No acute abnormality.   Original Report Authenticated By: Janeece Riggers, M.D.   1. NSTEMI (non-ST elevated myocardial infarction)     MDM  Persistent, stuttering chest pain, with known coronary artery disease, and significant obstructive lesion in a small vessel, at heart  catheterization 12/13. His chest pain, resolved after treatment at home, and he is pain-free in the emergency department. Patient will need urgent cardiology evaluation and treatment.   Nursing Notes Reviewed/ Care Coordinated, and agree without changes. Applicable Imaging Reviewed.  Interpretation of Laboratory Data incorporated into ED treatment   Plan: Admit  Flint Melter, MD 10/14/12 1635

## 2012-10-14 NOTE — ED Notes (Signed)
[facility] PA at bedside. 

## 2012-10-14 NOTE — ED Notes (Signed)
Patient states all symptoms resolved at this time.   Patient states that his chest pain resolved after 30 minutes last night.

## 2012-10-14 NOTE — CV Procedure (Signed)
   Cardiac Catheterization Procedure Note  Name: Paul Strickland MRN: [id number] DOB: 1945/04/21  Procedure: Left Heart Cath, Selective Coronary Angiography, LV angiography, PTCA and stenting of the PLOM branch of the RCA  Indication: 67 yo WM with history of CAD presents with a NSTEMI. Class 3 angina.   Procedural Details:  The right wrist was prepped, draped, and anesthetized with 1% lidocaine. Using the modified Seldinger technique, a 6 French sheath was introduced into the right radial artery. 3 mg of verapamil was administered through the sheath, weight-based unfractionated heparin was administered intravenously. Standard Judkins catheters were used for selective coronary angiography and left ventriculography. Catheter exchanges were performed over an exchange length guidewire.  PROCEDURAL FINDINGS Hemodynamics: AO 123/75 mean 97 mm Hg LV 124/12 mm Hg   Coronary angiography: Coronary dominance: right  Left mainstem: Normal  Left anterior descending (LAD): The LAD is a relatively small vessel with 40% proximal disease. In the mid vessel there is focal 40-50% stenosis.  Left circumflex (LCx): There is 30% narrowing in the proximal vessel. The first OM is normal. After the OM the LCx terminates in a small branch in the AV groove. This branch has a 90% stenosis.  Right coronary artery (RCA): The RCA is a large dominant vessel. It has 30-40% eccentric and irregular plaque in the mid vessel. The PLOM branch of the RCA has a segmental 90-95% stenosis.  Left ventriculography: Left ventricular systolic function is normal, LVEF is estimated at 55-65%, there is no significant mitral regurgitation   PCI Note:  Following the diagnostic procedure, the decision was made to proceed with PCI of the PLOM.  Weight-based heparin was given for anticoagulation. Once a therapeutic ACT was achieved, a 6 [place] FR4 guide catheter was inserted.  A prowater coronary guidewire was used to cross the lesion.   The lesion was predilated with a 2.0 mm balloon.  The lesion was then stented with a 2.25 x 24 mm Promus premier stent.  Following PCI, there was 0% residual stenosis and TIMI-3 flow. Final angiography confirmed an excellent result. The patient tolerated the procedure well. There were no immediate procedural complications. A TR band was used for radial hemostasis. The patient was transferred to the post catheterization recovery area for further monitoring.  PCI Data: Vessel - RCA/Segment - PLOM Percent Stenosis (pre)  95% TIMI-flow 2 Stent 2.25 x 24 mm Promus premier Percent Stenosis (post) 0% TIMI-flow (post) 3  Final Conclusions:   1. 2 vessel obstructive CAD. The lesion in the PLOM is new compared to 12/13. 2. Normal LV function. 3. Successful stenting of the PLOM branch with a DES.   Recommendations:  Continue dual antiplatelet therapy for at least one year.  Theron Arista [facility] 10/14/2012, 3:30 PM

## 2012-10-14 NOTE — H&P (Signed)
History and Physical  Patient ID: Paul Strickland MRN: [id number], DOB: Jul 31, 1945 Date of Encounter: 10/14/2012, 11:47 AM Primary Physician: Donzetta Sprung, MD Primary Cardiologist: Clifton James  Chief Complaint: chest pain Reason for Admission: CP, elevated troponin  HPI: Mr. Torrez is a 67 y/o M with history of HTN, DM, dyslipidemia, early dementia, CAD (NSTEMI 01/2012, moderate dz LAD/RCA, 99% AV groove Cx too small for PCI -> med rx) who presented to [facility] today with intermittent chest pain over the past 2 weeks. Two weeks ago, he had rare sporadic chest pain with ambulation. However, 5 days ago this became much more frequent to the point where now anytime he walks just a few steps he has chest tightness and dyspnea. He has been taking frequent SL NTG to relieve the pain. It also improves with rest. He was urged to go to the ER last Thursday when his wife called our office, but he declined. He was finally willing to come today because he had several episodes of pain overnight while trying to sleep. He took 6 full dose ASA but the pain continued in a stuttering fashion. He denies any nausea, vomiting, diaphoresis, palpitations, syncope, LEE, orthopnea, or med noncompliance. In the ER, he was hypertensive up to 166/107. Initial POC troponin 0.57, pBNP 2925, WBC 13.2, otherwise labs nonacute. EKG with TWI I, avL as before but new TW changes in V2, V4-V6.  Past Medical History  Diagnosis Date  . HTN (hypertension)   . Diabetes mellitus type 2, diet-controlled   . Mixed dyslipidemia     Low HDL, elevated TRIG  . CAD (coronary artery disease)     a. admx with NSTEMI/[place] 12/13 => tx from [place] (nl TnI at [facility]) - [facility] 02/04/12:  pLAD 40%, mLAD 50% (2 mm vessel), AV groove CFX 99% (very small-1.5-1.7 mm), RCA serial 40% mid, distal 50-60%, EF 55%. => med Rx.  Marland Kitchen Hx of echocardiogram     a. Echo 02/04/12: EF 50-55%, no wall motion abnormalities, grade 1 diastolic dysfunction, MAC, mild LAE,  mild RVE with moderately reduced RV systolic function, mild RAE  . Carotid stenosis     a. dopplers 12/13: RICA < 40%  . Dementia     a. Early dementia, on Exelon. Mother had early onset dementia.  . Acute renal insufficiency     a. Cr 1.48 in 01/2012.     Most Recent Cardiac Studies: 2D Echo 01/2012 - Left ventricle: The cavity size was normal. Wall thickness was normal. Systolic function was normal. The estimated ejection fraction was 50%, in the range of 50% to 55%. Wall motion was normal; there were no regional wall motion abnormalities. Doppler parameters are consistent with abnormal left ventricular relaxation (grade 1 diastolic dysfunction). - Mitral valve: Calcified annulus. - Left atrium: The atrium was mildly dilated. - Right ventricle: The cavity size was mildly dilated. Systolic function was mildly reduced. - Right atrium: The atrium was mildly dilated.  Cardiac Cath 01/2012 Procedure Performed:  1. Left Heart Catheterization 2. Selective Coronary Angiography 3. Left ventricular angiogram Operator: Verne Carrow, MD  Arterial access site: Right radial artery.  Indication: 67 yo WM with history of DM, HTN admitted on transfer from [facility] with chest pain, one slightly abnormal troponin there but negative cardiac markers here. Cardiac cath to exclude obstructive CAD.  Procedure Details:  The risks, benefits, complications, treatment options, and expected outcomes were discussed with the patient. The patient and/or family concurred with the proposed plan, giving informed  consent. The patient was brought to the cath lab after IV hydration was begun and oral premedication was given. The patient was further sedated with Versed and Fentanyl. The right wrist was assessed with an Allens test which was positive. The right wrist was prepped and draped in a sterile fashion. 1% lidocaine was used for local anesthesia. Using the modified Seldinger access technique, a 5  French sheath was placed in the right radial artery. 3 mg Verapamil was given through the sheath. 5000 units IV heparin was given. Standard diagnostic catheters were used to perform selective coronary angiography. A pigtail catheter was used to perform a left ventricular angiogram. The sheath was removed from the right radial artery and a Terumo hemostasis band was applied at the arteriotomy site on the right wrist.  There were no immediate complications. The patient was taken to the recovery area in stable condition.  Hemodynamic Findings:  Central aortic pressure: 143/76  Left ventricular pressure: 150/11/23  Angiographic Findings:  Left main: No obstructive disease.  Left Anterior Descending Artery: Moderate caliber vessel in the proximal and mid segment with tapering to small caliber vessel in the mid and distal portion. The proximal vessel has diffuse 40% stenosis. The mid vessel has a focal 50% stenosis. The mid vessel appears to be a 2.0 mm vessel. The distal vessel is 1.5 mm in caliber and has mild plaque disease. The diagonal branch is moderate sized and has mild plaque disease.  Circumflex Artery: Moderate caliber vessel with a moderate caliber first obtuse marginal branch. Just beyond the takeoff of the marginal branch, the AV groove Circumflex becomes very small in caliber and has a 99% stenosis. This vessel is 1.5-1.7 mm in diameter.  Right Coronary Artery: Large dominant artery with mild proximal vessel plaque, serial 40% stenoses mid vessel and focal 50-60% distal stenosis. The PDA and PLA have mild plaque disease.  Left Ventricular Angiogram: LVEF 55%.  Impression:  1. Unstable angina secondary to severe stenosis in very small caliber Circumflex lesion as above. This vessel is too small for PCI.  2. Moderate non-obstructive disease in the LAD and RCA.  3. Preserved LV systolic function  Recommendations: I would recommend medical management of his CAD. His likely culprit for his  unstable angina is his very small caliber mid AV groove Circumflex stenosis. This vessel is too small for PCI. Any attempt at PCI would also jeopardize the much larger obtuse marginal branch that arises just before the severe stenosis. Because of his presentation, would favor treatment with ASA and Plavix with addition of beta blocker, statin and long acting nitrate therapy. Monitor overnight and d/c home in am if stable. Will check echo and carotid artery dopplers today.  Complications: None. The patient tolerated the procedure well.      Surgical History: History reviewed. No pertinent past surgical history.   Home Meds: Prior to Admission medications   Medication Sig Start Date End Date Taking? Authorizing Provider  aspirin (ASPIRIN EC) 81 MG EC tablet Take 81 mg by mouth daily. Swallow whole.   Yes Historical Provider, MD  aspirin 325 MG tablet Take 1,625 mg by mouth once as needed for pain.   Yes Historical Provider, MD  atorvastatin (LIPITOR) 80 MG tablet Take 1 tablet (80 mg total) by mouth daily. 02/05/12  Yes Rhonda G Barrett, PA-C  clopidogrel (PLAVIX) 75 MG tablet Take 1 tablet (75 mg total) by mouth daily. 02/05/12  Yes Rhonda G Barrett, PA-C  isosorbide mononitrate (IMDUR) 30 MG 24 hr  tablet Take 1 tablet (30 mg total) by mouth daily. 02/05/12  Yes Rhonda G Barrett, PA-C  nitroGLYCERIN (NITROSTAT) 0.4 MG SL tablet Place 1 tablet (0.4 mg total) under the tongue every 5 (five) minutes as needed for chest pain. 02/05/12  Yes Rhonda G Barrett, PA-C  Rivastigmine 13.3 MG/24HR PT24 Place 1 patch onto the skin daily.   Yes Historical Provider, MD    Allergies:  Allergies  Allergen Reactions  . Penicillins Rash    History   Social History  . Marital Status: Married    Spouse Name: N/A    Number of Children: N/A  . Years of Education: N/A   Occupational History  . Not on file.   Social History Main Topics  . Smoking status: Former Smoker    Types: Cigarettes    Quit date:  02/03/1967  . Smokeless tobacco: Never Used  . Alcohol Use: No  . Drug Use: No  . Sexual Activity: Not on file   Other Topics Concern  . Not on file   Social History Narrative  . No narrative on file     Family History  Problem Relation Age of Onset  . Dementia Mother     Early onset  . CAD Father     MI at 64    Review of Systems: General: negative for chills, fever, night sweats or weight changes.  Cardiovascular: see above Dermatological: negative for rash Respiratory: negative for cough or wheezing Urologic: negative for hematuria Abdominal: negative for nausea, vomiting, diarrhea, bright red blood per rectum, melena, or hematemesis Neurologic: negative for visual changes, syncope. Chronic memory issues.  All other systems reviewed and are otherwise negative except as noted above.  Labs:   Lab Results  Component Value Date   WBC 13.2* 10/14/2012   HGB 16.0 10/14/2012   HCT 43.9 10/14/2012   MCV 83.6 10/14/2012   PLT 293 10/14/2012    Recent Labs Lab 10/14/12 0947  NA 138  K 4.5  CL 101  CO2 26  BUN 14  CREATININE 1.05  CALCIUM 9.5  GLUCOSE 170*   POC troponin 0.57 Lab Results  Component Value Date   CHOL 164 02/04/2012   HDL 27* 02/04/2012   LDLCALC 83 02/04/2012   TRIG 269* 02/04/2012    Radiology/Studies:  Dg Chest 2 View 10/14/2012   *RADIOLOGY REPORT*  Clinical Data: Chest pain  CHEST - 2 VIEW  Comparison: None  Findings: Heart size and vascularity are normal.  Negative for heart failure.  Negative for infiltrate effusion or mass.  Negative for pneumonia.  IMPRESSION: No acute abnormality.   Original Report Authenticated By: Janeece Riggers, M.D.    EKG: NSR 61bpm, TWI I, avL, V2, V4-V6. Changes in V leads appear new.  Physical Exam: Blood pressure 146/81, pulse 60, temperature 98.1 F (36.7 C), temperature source Oral, resp. rate 23, height 5\' 8"  (1.727 m), weight 200 lb (90.719 kg), SpO2 97.00%. General: Well developed, well nourished WM, in no acute  distress. Head: Normocephalic, atraumatic, sclera non-icteric, no xanthomas, nares are without discharge.  Neck: Negative for carotid bruits. JVD not elevated. Lungs: Clear bilaterally to auscultation without wheezes, rales, or rhonchi. Breathing is unlabored. Heart: RRR with S1 S2. No murmurs, rubs, or gallops appreciated. Abdomen: Soft, non-tender, non-distended with normoactive bowel sounds. No hepatomegaly. No rebound/guarding. No obvious abdominal masses. Msk:  Strength and tone appear normal for age. Extremities: No clubbing or cyanosis. No edema.  Distal pedal pulses are 2+ and equal bilaterally. Neuro:  Alert and oriented X 3. No focal deficit. No facial asymmetry. Moves all extremities spontaneously. Defers to wife frequently with assistance in answering historical questions. Psych:  Responds to questions appropriately with a normal affect.    ASSESSMENT AND PLAN:  1. Chest pain, elevated troponin with known history of CAD - continue ASA, statin, Plavix. Not on beta blocker due to bradycardia and h/o fatigue. He already took 6 full dose ASA at home overnight. Add heparin per pharmacy. Check P2Y12. Plan cath today.  Risks and benefits of cardiac catheterization have been discussed with the patient.  These include bleeding, infection, kidney damage, stroke, heart attack, death.  The patient understands these risks and is willing to proceed.  pBNP elevated but pt clinically not currently in heart failure. 2. HTN - per discussion with Dr. [facility] Emms, will add cozaar 25mg  daily. Continue Imdur. Follow BP and follow BMET. 3. Diabetes mellitus - does not appear to be on any hypoglycemic agents at home. Add SSI. 4. Dyslipidemia - continue statin. 5. Early-onset dementia - continue Exelon. 6. Leukocytosis - ? R/t cardiac process. Follow. Afebrile.  Signed, Ronie Spies PA-C 10/14/2012, 11:47 AM  Patient examined chart reviewed. Progressive symptoms consistant with angina with known disease. No acute  ECG changes but elevated troponin  Heparin.  Check P2Y  Discussed cath with patient willing to proceed Last cath done radially and has good pulse in radial now. Some dementia evident on history  Charlton Haws

## 2012-10-14 NOTE — ED Notes (Signed)
Pt reports he has CP last night after getting home from work, pain resolved after 30 minutes. Pt took "a couple" aspirin at onset. Pt wife urged him to come today for eval but he has no cp now. A&OX4, resp e/u

## 2012-10-14 NOTE — Progress Notes (Signed)
CRITICAL VALUE ALERT  Critical value received:  troponin  Date of notification: 10/14/12    Time of notification:  1900  Critical value read back:yes  Nurse who received alert:  N. Lenard Lance RN  MD notified (1st page):  Ronie Spies PA  Time of first page:  1900  MD notified (2nd page):  Time of second page:  Responding MD:  Ronie Spies PA  Time MD responded:  1900 , report called while talking with above PA on phone

## 2012-10-15 ENCOUNTER — Encounter (HOSPITAL_COMMUNITY): Payer: Self-pay | Admitting: Physician Assistant

## 2012-10-15 ENCOUNTER — Other Ambulatory Visit: Payer: Self-pay | Admitting: Physician Assistant

## 2012-10-15 DIAGNOSIS — F0390 Unspecified dementia without behavioral disturbance: Secondary | ICD-10-CM | POA: Diagnosis not present

## 2012-10-15 DIAGNOSIS — E782 Mixed hyperlipidemia: Secondary | ICD-10-CM | POA: Diagnosis not present

## 2012-10-15 DIAGNOSIS — I10 Essential (primary) hypertension: Secondary | ICD-10-CM

## 2012-10-15 DIAGNOSIS — I214 Non-ST elevation (NSTEMI) myocardial infarction: Secondary | ICD-10-CM | POA: Diagnosis not present

## 2012-10-15 DIAGNOSIS — I6529 Occlusion and stenosis of unspecified carotid artery: Secondary | ICD-10-CM | POA: Diagnosis not present

## 2012-10-15 DIAGNOSIS — I2510 Atherosclerotic heart disease of native coronary artery without angina pectoris: Secondary | ICD-10-CM

## 2012-10-15 DIAGNOSIS — D72829 Elevated white blood cell count, unspecified: Secondary | ICD-10-CM

## 2012-10-15 DIAGNOSIS — D720 Genetic anomalies of leukocytes: Secondary | ICD-10-CM

## 2012-10-15 LAB — CBC
HCT: 41.2 % (ref 39.0–52.0)
MCHC: 35.7 g/dL (ref 30.0–36.0)
Platelets: 273 10*3/uL (ref 150–400)
RDW: 13.2 % (ref 11.5–15.5)
WBC: 12.4 10*3/uL — ABNORMAL HIGH (ref 4.0–10.5)

## 2012-10-15 LAB — BASIC METABOLIC PANEL
Chloride: 103 mEq/L (ref 96–112)
GFR calc Af Amer: >90 mL/min (ref >90)
GFR calc non Af Amer: 84 mL/min — ABNORMAL LOW (ref >90)
Potassium: 3.8 mEq/L (ref 3.5–5.1)
Sodium: 137 mEq/L (ref 135–145)

## 2012-10-15 LAB — TROPONIN I: Troponin I: 4.51 ng/mL (ref <0.30)

## 2012-10-15 MED ORDER — NITROGLYCERIN 0.4 MG SL SUBL: 0.4000 mg | SUBLINGUAL_TABLET | SUBLINGUAL | Status: DC | PRN

## 2012-10-15 MED ORDER — LOSARTAN POTASSIUM 25 MG PO TABS: 25.0000 mg | ORAL_TABLET | Freq: Every day | ORAL | Status: DC

## 2012-10-15 NOTE — Progress Notes (Signed)
TR BAND REMOVAL  LOCATION:    right radial  DEFLATED PER PROTOCOL:    yes  TIME BAND OFF / DRESSING APPLIED:    19:30   SITE UPON ARRIVAL:    Level 0  SITE AFTER BAND REMOVAL:    Level 0  REVERSE ALLEN'S TEST:      CIRCULATION SENSATION AND MOVEMENT:    Within Normal Limits   yes  COMMENTS:  Pt tolerated removal of TR band without complications, will continue to monitor patient.

## 2012-10-15 NOTE — Progress Notes (Signed)
Utilization review completed.  P.J. Cora Stetson,RN,BSN Case Manager [phone]  

## 2012-10-15 NOTE — Discharge Summary (Signed)
See full note this am. cdm 

## 2012-10-15 NOTE — Discharge Summary (Signed)
Discharge Summary   Patient ID: EMIT KUENZEL MRN: [id number], DOB/AGE: 68-Nov-1947 68 y.o. Admit date: 10/14/2012 D/C date:     10/15/2012  Primary Cardiologist: Clifton James  Primary Discharge Diagnoses:  1. CAD with NSTEMI this admission, s/p PTCA and stenting of the PLOM branch of the RCA - med rx for residual dz including 90% AV groove Cx (previously deemed too small for PCI) & nonobst dz in RCA/LCx/LAD otherwise - PRU adequate on Plavix at 106 - EF WNL this admission - history: NSTEMI 01/2012 due to AV groove Cx lesion, too small for PCI 2. HTN - Cozaar initiated, f/u BMET 1 week 3. Diabetes mellitus 4. Dyslipidemia   5. Early-onset dementia   6. Leukocytosis, may be related to MI - for outpt repeat CBC, may need f/u PCP if not resolved   Secondary Discharge Diagnoses:  1. Carotid stenosis - dopplers 12/13: RICA < 40% 2. Bradycardia - HR 60's, fatigue in 05/2012 (beta blocker discontinued then) 3. Skin cancer, excised from back in 2012 4. Melanoma, exercised from head in 2012, did not spread 5. Sleep apnea - per pt, resolved after losing a lot of weight 6. Kidney stones 7. Acute renal insufficiency in 01/2012 with resolution  Hospital Course: Mr. Rosko is a 67 y/o M with history of HTN, DM, dyslipidemia, early dementia, CAD (NSTEMI 01/2012, moderate dz LAD/RCA, 99% AV groove Cx too small for PCI -> med rx) who presented to [facility] yesterday with intermittent chest pain over the past 2 weeks. More recently over the past 5 days he began to have chest pain occurring reliably with ambulation, relieved with NTG and rest. This became more frequent and more severe. He was urged by our office to come to the ER for evaluation but declined to so do until day of admission. He had begun having rest pain overnight. The night before admission he took 6 ASA to relieve the pain but it persisted so he came to the ER. He denied any nausea, vomiting, diaphoresis, palpitations, syncope, LEE,  orthopnea, or med noncompliance. In the ER he was hypertensive up to 166/107. Initial POC troponin was elevated at 0.57. pBNP 2925 but he did not have any clinical signs of CHF. EKG showed NSR with new TW changes V2-V6. He was started on heparin per pharmacy and started on Cozaar for blood pressure. Troponin peaked at 6.37, c/w NSTEMI. He underwent cath demonstrating a new significant lesion in the [facility], which was treated with PTCA/DES. The known AV groove Cx lesion will continue to be treated medically, along with other nonobstructive CAD in the LAD/CX/RCA. DAPT for at least 1 year was recommended PRU was 106, indicating good response to Plavix. The patient had a leukocytosis that was felt due to his MI. He was afebrile and CXR was nonacute. Will f/u as below. Today the patient feels well. He has ambulated with cardiac rehab. Dr. Clifton James has seen and examined the patient today and feels he is stable for discharge.   Follow-up issues: - The patient was instructed to f/u PCP for his diabetes given that he may need oral treatment (not on any home meds for this). The patient and his wife have also been in ongoing discussions about driving limitations. - We will recheck BMET 1 week given Cozaar initiation - At the BMET check, will also draw CBC to make sure WBC returns to normal given prior history of elevated WBC. If it does not, will need to f/u PCP.  Discharge Vitals: Blood  pressure 132/81, pulse 67, temperature 97.1 F (36.2 C), temperature source Oral, resp. rate 20, height 5\' 8"  (1.727 m), weight 201 lb 4.5 oz (91.3 kg), SpO2 98.00%.  Labs: Lab Results  Component Value Date   WBC 12.4* 10/15/2012   HGB 14.7 10/15/2012   HCT 41.2 10/15/2012   MCV 84.1 10/15/2012   PLT 273 10/15/2012    Recent Labs Lab 10/14/12 1835 10/15/12 0249  NA  --  137  K  --  3.8  CL  --  103  CO2  --  24  BUN  --  13  CREATININE  --  0.97  CALCIUM  --  8.5  PROT 7.4  --   BILITOT 0.7  --   ALKPHOS 52  --   ALT  23  --   AST 33  --   GLUCOSE  --  153*    Recent Labs  10/14/12 1836 10/14/12 2025 10/15/12 0249  TROPONINI 5.90* 6.37* 4.51*   Lab Results  Component Value Date   CHOL 164 02/04/2012   HDL 27* 02/04/2012   LDLCALC 83 02/04/2012   TRIG 269* 02/04/2012    Diagnostic Studies/Procedures   Cardiac catheterization this admission, please see full report and above for summary.  Dg Chest 2 View 10/14/2012   *RADIOLOGY REPORT*  Clinical Data: Chest pain  CHEST - 2 VIEW  Comparison: None  Findings: Heart size and vascularity are normal.  Negative for heart failure.  Negative for infiltrate effusion or mass.  Negative for pneumonia.  IMPRESSION: No acute abnormality.   Original Report Authenticated By: Janeece Riggers, M.D.    Discharge Medications     Medication List         aspirin EC 81 MG EC tablet  Generic drug:  aspirin  Take 81 mg by mouth daily. Swallow whole.     atorvastatin 80 MG tablet  Commonly known as:  LIPITOR  Take 1 tablet (80 mg total) by mouth daily.     clopidogrel 75 MG tablet  Commonly known as:  PLAVIX  Take 1 tablet (75 mg total) by mouth daily.     isosorbide mononitrate 30 MG 24 hr tablet  Commonly known as:  IMDUR  Take 1 tablet (30 mg total) by mouth daily.     losartan 25 MG tablet  Commonly known as:  COZAAR  Take 1 tablet (25 mg total) by mouth daily.     nitroGLYCERIN 0.4 MG SL tablet  Commonly known as:  NITROSTAT  Place 1 tablet (0.4 mg total) under the tongue every 5 (five) minutes as needed for chest pain (up to 3 doses).     Rivastigmine 13.3 MG/24HR Pt24  Place 1 patch onto the skin daily.        Disposition   The patient will be discharged in stable condition to home.     Discharge Orders   Future Appointments Provider Department Dept Phone   10/22/2012 8:30 AM [facility] [employer] Main Office [place]) [phone]   11/03/2012 3:40 PM Beatrice Lecher, PA-C [facility] Main Office [place]) [phone]    Future Orders Complete By Expires   Diet - low sodium heart healthy  As directed    Comments:     Diabetic diet   Increase activity slowly  As directed    Comments:     No driving for 1 week. Talk to your primary care doctor about long-term driving restrictions. No lifting over 10 lbs for 2 weeks. No sexual  activity for 2 weeks. Keep procedure site clean & dry. If you notice increased pain, swelling, bleeding or pus, call/return!  You may shower, but no soaking baths/hot tubs/pools for 1 week.     Follow-up Information   Follow up with Donzetta Sprung, MD. (To discuss treatment of your diabetes. You may need to be on medication for this.)    Specialty:  Family Medicine   Contact information:   [address] [place] [address] [phone]       Follow up with [facility]. (Labwork only 10/22/12 - may come in anytime between 8am-4pm)    Contact information:   [address] [place] [address]       Follow up with Tereso Newcomer, PA-C. (11/03/12 at 3:40pm)    Specialty:  Physician Assistant   Contact information:   [address] [place] [address] [phone]         Duration of Discharge Encounter: Greater than 30 minutes including physician and PA time.  Signed, Ronie Spies PA-C 10/15/2012, 8:36 AM

## 2012-10-15 NOTE — Progress Notes (Signed)
CARDIAC REHAB PHASE I   PRE:  Rate/Rhythm: 57SB  BP:  Supine: 152/87  Sitting:   Standing:    SaO2:   MODE:  Ambulation: 1000 ft   POST:  Rate/Rhythm: 81SR  BP:  Supine:   Sitting: 167/75  Standing:    SaO2:  0755-0850 Pt walked 1000 ft with steady gait without CP. Tolerated well. Education completed with pt and wife. Pt will need re enforcement of ed due to dementia. Wife with good understanding of material reviewed. Discussed CRP 2 as pt did not attend in Dec but willing to go now. Will refer to [facility] [facility] as [facility] does not have a program any more.    Luetta Nutting, RN BSN  10/15/2012 8:46 AM

## 2012-10-15 NOTE — Progress Notes (Signed)
Patient: Paul Strickland / Admit Date: 10/14/2012 / Date of Encounter: 10/15/2012, 6:43 AM   Subjective  No further chest pain. Slept well. No complaints.   Objective   Telemetry: NSR/SB Physical Exam: Filed Vitals:   10/15/12 0500  BP: 132/81  Pulse: 66  Temp: 97.1 F (36.2 C)  Resp: 17   General: Well developed, well nourished WM, in no acute distress.  Head: Normocephalic, atraumatic, sclera non-icteric, no xanthomas, nares are without discharge.  Neck: Negative for carotid bruits. JVD not elevated.  Lungs: Clear bilaterally to auscultation without wheezes, rales, or rhonchi. Breathing is unlabored.  Heart: RRR with S1 S2. No murmurs, rubs, or gallops appreciated.  Abdomen: Soft, non-tender, non-distended with normoactive bowel sounds. No hepatomegaly. No rebound/guarding. No obvious abdominal masses.  Msk: Strength and tone appear normal for age.  Extremities: No clubbing or cyanosis. No edema. Distal pedal pulses are 2+ and equal bilaterally. R wrist without oozing, hematoma or ecchymosis Neuro: Alert and oriented X 3. No focal deficit. No facial asymmetry. Moves all extremities spontaneously. Psych: Responds to questions appropriately with a normal affect.      Intake/Output Summary (Last 24 hours) at 10/15/12 0643 Last data filed at 10/14/12 2018  Gross per 24 hour  Intake 786.08 ml  Output      0 ml  Net 786.08 ml    Inpatient Medications:  . aspirin  81 mg Oral Daily  . atorvastatin  80 mg Oral Daily  . clopidogrel  75 mg Oral Q breakfast  . insulin aspart  0-9 Units Subcutaneous TID WC  . isosorbide mononitrate  30 mg Oral Daily  . losartan  25 mg Oral Daily  . Rivastigmine  1 patch Transdermal Daily    Labs:  Recent Labs  10/14/12 0947 10/15/12 0249  NA 138 137  K 4.5 3.8  CL 101 103  CO2 26 24  GLUCOSE 170* 153*  BUN 14 13  CREATININE 1.05 0.97  CALCIUM 9.5 8.5    Recent Labs  10/14/12 1835  AST 33  ALT 23  ALKPHOS 52  BILITOT 0.7    PROT 7.4  ALBUMIN 4.0    Recent Labs  10/14/12 1200 10/15/12 0249  WBC 15.1* 12.4*  HGB 16.3 14.7  HCT 44.8 41.2  MCV 83.1 84.1  PLT 339 273    Recent Labs  10/14/12 1836 10/14/12 2025 10/15/12 0249  TROPONINI 5.90* 6.37* 4.51*   No components found with this basename: POCBNP,  No results found for this basename: HGBA1C,  in the last 72 hours   Radiology/Studies:  Dg Chest 2 View  10/14/2012   *RADIOLOGY REPORT*  Clinical Data: Chest pain  CHEST - 2 VIEW  Comparison: None  Findings: Heart size and vascularity are normal.  Negative for heart failure.  Negative for infiltrate effusion or mass.  Negative for pneumonia.  IMPRESSION: No acute abnormality.   Original Report Authenticated By: Janeece Riggers, M.D.     Assessment and Plan  1. Chest pain/NSTEMI/CAD s/p PTCA and stenting of the PLOM branch of the RCA - not on BB due to h/o fatigue/bradycardia. Continue ASA/Statin/Plavix (PRU 106). Med rx for residual dz including 90% AV groove Cx (previously deemed too small for PCI) & nonobst dz in RCA/LCx/LAD otherwise. Cont DAPT for at least 1 yr. 2. HTN - improved. Continue Cozaar. Outpt BMET 1 week.  3. Diabetes mellitus -not on any oral hypoglycemics. F/u PCP. 4. Dyslipidemia - continue statin.  5. Early-onset dementia - continue Exelon.  6. Leukocytosis - ? R/t cardiac process. Improving. Afebrile.  Ambulate with cardiac rehab. DC today if no complication.  Signed, Ronie Spies PA-C  I have personally seen and examined this patient with Ronie Spies, PA-C. I agree with the assessment and plan as outlined above. He has done well post PCI. DES placed yesterday in right sided PL branch. He is on ASA and PLavix. Will d/c home today after he walks with cardiac rehab. He can follow up with me, Tereso Newcomer or Norma Fredrickson in 2-3 weeks.   Mihir Flanigan 7:20 AM 10/15/2012

## 2012-10-17 DIAGNOSIS — I10 Essential (primary) hypertension: Secondary | ICD-10-CM | POA: Diagnosis not present

## 2012-10-17 DIAGNOSIS — E782 Mixed hyperlipidemia: Secondary | ICD-10-CM | POA: Diagnosis not present

## 2012-10-22 ENCOUNTER — Other Ambulatory Visit (INDEPENDENT_AMBULATORY_CARE_PROVIDER_SITE_OTHER): Payer: Medicare Other

## 2012-10-22 DIAGNOSIS — D72829 Elevated white blood cell count, unspecified: Secondary | ICD-10-CM | POA: Diagnosis not present

## 2012-10-22 DIAGNOSIS — I10 Essential (primary) hypertension: Secondary | ICD-10-CM | POA: Diagnosis not present

## 2012-10-22 LAB — BASIC METABOLIC PANEL
Calcium: 9.4 mg/dL (ref 8.4–10.5)
GFR: 64.65 mL/min (ref >60.00)
Potassium: 4.5 mEq/L (ref 3.5–5.1)
Sodium: 137 mEq/L (ref 135–145)

## 2012-10-22 LAB — CBC
Hemoglobin: 15.1 g/dL (ref 13.0–17.0)
MCHC: 33.6 g/dL (ref 30.0–36.0)
Platelets: 299 10*3/uL (ref 150.0–400.0)
RDW: 13.1 % (ref 11.5–14.6)
WBC: 9.8 10*3/uL (ref 4.5–10.5)

## 2012-11-03 ENCOUNTER — Encounter: Payer: Self-pay | Admitting: Physician Assistant

## 2012-11-03 ENCOUNTER — Ambulatory Visit (INDEPENDENT_AMBULATORY_CARE_PROVIDER_SITE_OTHER): Payer: Medicare Other | Admitting: Physician Assistant

## 2012-11-03 VITALS — BP 140/81 | HR 62 | Ht 68.0 in | Wt 213.0 lb

## 2012-11-03 DIAGNOSIS — I6529 Occlusion and stenosis of unspecified carotid artery: Secondary | ICD-10-CM

## 2012-11-03 DIAGNOSIS — I10 Essential (primary) hypertension: Secondary | ICD-10-CM

## 2012-11-03 DIAGNOSIS — I6521 Occlusion and stenosis of right carotid artery: Secondary | ICD-10-CM

## 2012-11-03 DIAGNOSIS — I2510 Atherosclerotic heart disease of native coronary artery without angina pectoris: Secondary | ICD-10-CM | POA: Diagnosis not present

## 2012-11-03 DIAGNOSIS — E785 Hyperlipidemia, unspecified: Secondary | ICD-10-CM | POA: Diagnosis not present

## 2012-11-03 NOTE — Progress Notes (Signed)
[address] [place], [place]  [address] Phone: [phone] Fax:  [phone]  Date:  11/03/2012   ID:  Paul Strickland, Paul Strickland 1945/06/01, MRN [id number]  PCP:  Donzetta Sprung, MD  Cardiologist:  Dr. Verne Carrow     History of Present Illness: Paul Strickland is a 67 y.o. male who returns for follow up after a recent admission to the hospital 8/19-8/20 with a NSTEMI.  He has a hx of DM2, HTN and early dementia. He was admitted 01/2012 with a NSTEMI.  LHC 02/04/12: pLAD 40%, mLAD 50% (2 mm vessel), AV groove CFX 99% (very small-1.5-1.7 mm), RCA serial 40% mid, distal 50-60%, EF 55%. Medical management recommended. Culprit was likely a very small caliber mid AV groove circumflex lesion. This was too small for PCI. Echo 02/04/12: EF 50-55%, no wall motion abnormalities, grade 1 diastolic dysfunction, MAC, mild LAE, mild RVE with moderately reduced RV systolic function, mild RAE. Carotid Dopplers 02/05/12: RICA < 40%.  Last seen in the office 05/2012.  He was admitted to the hospital after presenting with worsening CP.  He ruled in for NSTEMI.  LHC 10/14/12:  pLAD 40, mLAD 40-50, pCFX 30, AVCFX 90, mRCA 30-40, PLOM of RCA 90-95, EF 55-65%.  PCI:  Promus Premier DES to [facility] (new lesion).  AVCFX lesion to be treated medically.  PRU was 106 so Plavix was continued.  He was asked to follow up with PCP for management of diabetes.  Losartan was started this admission.  Follow up BMET stable.  Since d/c, he feels good.  The patient denies chest pain, shortness of breath, syncope, orthopnea, PND or significant pedal edema.   Labs (12/13): K 4.1, creatinine 1.05, LDL 83, Hgb 13.3, TSH 0.609 Labs (8/14):   K 4.5, Cr 1.2, ALT 23, Hgb 15.1, TSH 0.828  Wt Readings from Last 3 Encounters:  11/03/12 213 lb (96.616 kg)  10/15/12 201 lb 4.5 oz (91.3 kg)  10/15/12 201 lb 4.5 oz (91.3 kg)     Past Medical History  Diagnosis Date  . HTN (hypertension)   . Diabetes mellitus type 2,  diet-controlled   . Mixed dyslipidemia     Low HDL, elevated TRIG  . CAD (coronary artery disease)     a. NSTEMI 12/13 - LHC AV groove CFX 99% too small for PCI, o/w nonobst dz => med Rx. b. NSTEMI 09/2012: s/p PTCA/DES to PLOM of RCA, known AV groove Cx stenosis, o/w nonobstructive dz for med rx. EF 55-60%.  Marland Kitchen Hx of echocardiogram     a. Echo 02/04/12: EF 50-55%, no wall motion abnormalities, grade 1 diastolic dysfunction, MAC, mild LAE, mild RVE with moderately reduced RV systolic function, mild RAE  . Carotid stenosis     a. dopplers 12/13: RICA < 40%  . Dementia     a. Early dementia, on Exelon. Mother had early onset dementia.  . Bradycardia     a. HR 60's, fatigue in 05/2012 - beta blocker discontinued.  . Skin cancer     a. Excised from back in 2012.  . Melanoma     a. Exercised from head in 2012, did not spread.  . Sleep apnea     "resolved after losing alot of weight" (10/14/2012)  . Acute renal insufficiency     a. Cr 1.48 in 01/2012, with resolution.  . Kidney stones   . Leukocytosis     a. 09/2012 - for outpt repeat to ensure improvement.    Current  Outpatient Prescriptions  Medication Sig Dispense Refill  . aspirin (ASPIRIN EC) 81 MG EC tablet Take 81 mg by mouth daily. Swallow whole.      Marland Kitchen atorvastatin (LIPITOR) 80 MG tablet Take 1 tablet (80 mg total) by mouth daily.  30 tablet  11  . clopidogrel (PLAVIX) 75 MG tablet Take 1 tablet (75 mg total) by mouth daily.  30 tablet  11  . isosorbide mononitrate (IMDUR) 30 MG 24 hr tablet Take 1 tablet (30 mg total) by mouth daily.  30 tablet  11  . losartan (COZAAR) 25 MG tablet Take 1 tablet (25 mg total) by mouth daily.  30 tablet  6  . nitroGLYCERIN (NITROSTAT) 0.4 MG SL tablet Place 1 tablet (0.4 mg total) under the tongue every 5 (five) minutes as needed for chest pain (up to 3 doses).      . Rivastigmine 13.3 MG/24HR PT24 Place 1 patch onto the skin daily.      Marland Kitchen ZOSTAVAX 96045 UNT/0.65ML injection        No current  facility-administered medications for this visit.    Allergies:    Allergies  Allergen Reactions  . Penicillins Rash    Social History:  The patient  reports that he quit smoking about 45 years ago. His smoking use included Cigarettes. He has a .48 pack-year smoking history. He has never used smokeless tobacco. He reports that  drinks alcohol. He reports that he does not use illicit drugs.   ROS:  Please see the history of present illness.     All other systems reviewed and negative.   PHYSICAL EXAM: VS:  BP 140/81  Pulse 62  Ht 5\' 8"  (1.727 m)  Wt 213 lb (96.616 kg)  BMI 32.39 kg/m2 Well nourished, well developed, in no acute distress HEENT: normal Neck: no JVD Cardiac:  normal S1, S2; RRR; no murmur Lungs:  clear to auscultation bilaterally, no wheezing, rhonchi or rales Abd: soft, nontender, no hepatomegaly Ext: no edema; right wrist without hematoma or mass  Skin: warm and dry Neuro:  CNs 2-12 intact, no focal abnormalities noted  EKG:  NSR, HR 62, normal axis, inf-lat TWI     ASSESSMENT AND PLAN:  1. CAD:  Doing well post DES to the [facility] in setting of NSTEMI.  We discussed the importance of dual antiplatelet therapy.  Continued medical Rx for the small caliber AVCFX.  No angina.  Continue ASA, Plavix, statin, nitrates.  He has not been tx with beta blockers in the past due to bradycardia.  Refer to Cardiac Rehab at [facility]. 2. Hypertension:  Fair control.  Continue current Rx and continue to monitor.   3. Hyperlipidemia:  Continue statin. 4. Carotid Stenosis:  Arrange follow up dopplers in 02/2013.   5. Disposition:  F/u with Dr. Verne Carrow in 2-3 mos.   Signed, Tereso Newcomer, PA-C  11/03/2012 4:12 PM

## 2012-11-03 NOTE — Patient Instructions (Signed)
Your physician recommends that you continue on your current medications as directed. Please refer to the Current Medication list given to you today.  Your physician has requested that you have a carotid duplex. This test is an ultrasound of the carotid arteries in your neck. It looks at blood flow through these arteries that supply the brain with blood. Allow one hour for this exam. There are no restrictions or special instructions. TO BE SCHEDULED JANUARY 2015  You have been referred to [facility])  Your physician recommends that you schedule a follow-up appointment in: 2-3 MONTHS WITH DR.MCALHANY

## 2012-11-05 ENCOUNTER — Telehealth: Payer: Self-pay

## 2012-11-05 NOTE — Telephone Encounter (Signed)
Called [facility] cardiac re[facility] to verify that referral completed through epic was suffiecent. advised that it was, ref  through epic go into to their supervisers que and they follow up with the pt.

## 2012-11-11 ENCOUNTER — Telehealth: Payer: Self-pay | Admitting: Cardiovascular Disease

## 2012-11-11 NOTE — Telephone Encounter (Signed)
Patient's wife wants to know if you have set her husband up for his cardiac rehab at [facility]. Please f/u with wife.

## 2012-11-12 NOTE — Telephone Encounter (Signed)
Referral has been made. I spoke with Harriett Sine at [facility] and she will contact supervisor who will call pt. Number for [facility] is [phone]. I spoke with pt's wife and gave her this information

## 2012-11-14 DIAGNOSIS — G4733 Obstructive sleep apnea (adult) (pediatric): Secondary | ICD-10-CM | POA: Diagnosis not present

## 2012-11-14 DIAGNOSIS — F0390 Unspecified dementia without behavioral disturbance: Secondary | ICD-10-CM | POA: Diagnosis not present

## 2012-11-14 DIAGNOSIS — E782 Mixed hyperlipidemia: Secondary | ICD-10-CM | POA: Diagnosis not present

## 2012-11-14 DIAGNOSIS — N200 Calculus of kidney: Secondary | ICD-10-CM | POA: Diagnosis not present

## 2012-11-14 DIAGNOSIS — I10 Essential (primary) hypertension: Secondary | ICD-10-CM | POA: Diagnosis not present

## 2012-11-14 DIAGNOSIS — Z23 Encounter for immunization: Secondary | ICD-10-CM | POA: Diagnosis not present

## 2012-11-14 DIAGNOSIS — IMO0001 Reserved for inherently not codable concepts without codable children: Secondary | ICD-10-CM | POA: Diagnosis not present

## 2012-11-26 DIAGNOSIS — L570 Actinic keratosis: Secondary | ICD-10-CM | POA: Diagnosis not present

## 2012-11-26 DIAGNOSIS — L821 Other seborrheic keratosis: Secondary | ICD-10-CM | POA: Diagnosis not present

## 2012-11-26 DIAGNOSIS — Z85828 Personal history of other malignant neoplasm of skin: Secondary | ICD-10-CM | POA: Diagnosis not present

## 2012-11-26 DIAGNOSIS — D485 Neoplasm of uncertain behavior of skin: Secondary | ICD-10-CM | POA: Diagnosis not present

## 2012-12-09 DIAGNOSIS — Z23 Encounter for immunization: Secondary | ICD-10-CM | POA: Diagnosis not present

## 2012-12-11 ENCOUNTER — Encounter (HOSPITAL_COMMUNITY)
Admission: RE | Admit: 2012-12-11 | Discharge: 2012-12-11 | Disposition: A | Discharge status: Home / Self Care | Payer: Medicare Other | Source: Ambulatory Visit | Attending: Cardiovascular Disease | Admitting: Cardiovascular Disease

## 2012-12-11 VITALS — BP 114/60 | HR 60 | Ht 68.0 in | Wt 213.6 lb

## 2012-12-11 DIAGNOSIS — I219 Acute myocardial infarction, unspecified: Secondary | ICD-10-CM

## 2012-12-11 DIAGNOSIS — Z5189 Encounter for other specified aftercare: Secondary | ICD-10-CM | POA: Insufficient documentation

## 2012-12-11 DIAGNOSIS — I252 Old myocardial infarction: Secondary | ICD-10-CM | POA: Insufficient documentation

## 2012-12-11 DIAGNOSIS — Z9861 Coronary angioplasty status: Secondary | ICD-10-CM | POA: Insufficient documentation

## 2012-12-11 NOTE — Patient Instructions (Signed)
Pt has finished orientation and is scheduled to start CR on 12/15/12 at 6:45 am. Pt has been instructed to arrive to class 15 minutes early for scheduled class. Pt has been instructed to wear comfortable clothing and shoes with rubber soles. Pt has been told to take their medications 1 hour prior to coming to class.  If the patient is not going to attend class, he/she has been instructed to call.   

## 2012-12-11 NOTE — Progress Notes (Signed)
Patient referred to CR by Dr. Clifton James due to recent MI 410.10 and stent placement V45.82. During orientation advised patient on arrival and appointment times what to wear, what to do before, during and after exercise. Reviewed attendance and class policy. Talked about inclement weather and class consultation policy. Pt is scheduled to start Cardiac Rehab on 12/15/12 at 6:45. Pt was advised to come to class 5 minutes before class starts. He was also given instructions on meeting with the dietician and attending the Family Structure classes. Pt is eager to get started. Patient will do the six minute walk.

## 2012-12-15 ENCOUNTER — Encounter (HOSPITAL_COMMUNITY)
Admission: RE | Admit: 2012-12-15 | Discharge: 2012-12-15 | Disposition: A | Discharge status: Home / Self Care | Payer: Medicare Other | Source: Ambulatory Visit | Attending: Cardiovascular Disease | Admitting: Cardiovascular Disease

## 2012-12-15 DIAGNOSIS — Z9861 Coronary angioplasty status: Secondary | ICD-10-CM | POA: Diagnosis not present

## 2012-12-15 DIAGNOSIS — I252 Old myocardial infarction: Secondary | ICD-10-CM | POA: Diagnosis not present

## 2012-12-15 DIAGNOSIS — Z5189 Encounter for other specified aftercare: Secondary | ICD-10-CM | POA: Diagnosis not present

## 2012-12-17 ENCOUNTER — Encounter (HOSPITAL_COMMUNITY)
Admission: RE | Admit: 2012-12-17 | Discharge: 2012-12-17 | Disposition: A | Discharge status: Home / Self Care | Payer: Medicare Other | Source: Ambulatory Visit | Attending: Cardiovascular Disease | Admitting: Cardiovascular Disease

## 2012-12-19 ENCOUNTER — Encounter (HOSPITAL_COMMUNITY)
Admission: RE | Admit: 2012-12-19 | Discharge: 2012-12-19 | Disposition: A | Discharge status: Home / Self Care | Payer: Medicare Other | Source: Ambulatory Visit | Attending: Cardiovascular Disease | Admitting: Cardiovascular Disease

## 2012-12-22 ENCOUNTER — Encounter (HOSPITAL_COMMUNITY)
Admission: RE | Admit: 2012-12-22 | Discharge: 2012-12-22 | Disposition: A | Discharge status: Home / Self Care | Payer: Medicare Other | Source: Ambulatory Visit | Attending: Cardiovascular Disease | Admitting: Cardiovascular Disease

## 2012-12-24 ENCOUNTER — Encounter (HOSPITAL_COMMUNITY)
Admission: RE | Admit: 2012-12-24 | Discharge: 2012-12-24 | Disposition: A | Discharge status: Home / Self Care | Payer: Medicare Other | Source: Ambulatory Visit | Attending: Cardiovascular Disease | Admitting: Cardiovascular Disease

## 2012-12-26 ENCOUNTER — Encounter (HOSPITAL_COMMUNITY)
Admission: RE | Admit: 2012-12-26 | Discharge: 2012-12-26 | Disposition: A | Discharge status: Home / Self Care | Payer: Medicare Other | Source: Ambulatory Visit | Attending: Cardiovascular Disease | Admitting: Cardiovascular Disease

## 2012-12-29 ENCOUNTER — Encounter (HOSPITAL_COMMUNITY)
Admission: RE | Admit: 2012-12-29 | Discharge: 2012-12-29 | Disposition: A | Discharge status: Home / Self Care | Payer: Medicare Other | Source: Ambulatory Visit | Attending: Cardiovascular Disease | Admitting: Cardiovascular Disease

## 2012-12-29 DIAGNOSIS — I252 Old myocardial infarction: Secondary | ICD-10-CM | POA: Diagnosis not present

## 2012-12-29 DIAGNOSIS — Z5189 Encounter for other specified aftercare: Secondary | ICD-10-CM | POA: Insufficient documentation

## 2012-12-29 DIAGNOSIS — Z9861 Coronary angioplasty status: Secondary | ICD-10-CM | POA: Insufficient documentation

## 2012-12-31 ENCOUNTER — Encounter (HOSPITAL_COMMUNITY)
Admission: RE | Admit: 2012-12-31 | Discharge: 2012-12-31 | Disposition: A | Discharge status: Home / Self Care | Payer: Medicare Other | Source: Ambulatory Visit | Attending: Cardiovascular Disease | Admitting: Cardiovascular Disease

## 2013-01-02 ENCOUNTER — Encounter (HOSPITAL_COMMUNITY)
Admission: RE | Admit: 2013-01-02 | Discharge: 2013-01-02 | Disposition: A | Discharge status: Home / Self Care | Payer: Medicare Other | Source: Ambulatory Visit | Attending: Cardiovascular Disease | Admitting: Cardiovascular Disease

## 2013-01-05 ENCOUNTER — Encounter (HOSPITAL_COMMUNITY)
Admission: RE | Admit: 2013-01-05 | Discharge: 2013-01-05 | Disposition: A | Discharge status: Home / Self Care | Payer: Medicare Other | Source: Ambulatory Visit | Attending: Cardiovascular Disease | Admitting: Cardiovascular Disease

## 2013-01-07 ENCOUNTER — Encounter (HOSPITAL_COMMUNITY)
Admission: RE | Admit: 2013-01-07 | Discharge: 2013-01-07 | Disposition: A | Discharge status: Home / Self Care | Payer: Medicare Other | Source: Ambulatory Visit | Attending: Cardiovascular Disease | Admitting: Cardiovascular Disease

## 2013-01-09 ENCOUNTER — Encounter (HOSPITAL_COMMUNITY)
Admission: RE | Admit: 2013-01-09 | Discharge: 2013-01-09 | Disposition: A | Discharge status: Home / Self Care | Payer: Medicare Other | Source: Ambulatory Visit | Attending: Cardiovascular Disease | Admitting: Cardiovascular Disease

## 2013-01-12 ENCOUNTER — Encounter (HOSPITAL_COMMUNITY)
Admission: RE | Admit: 2013-01-12 | Discharge: 2013-01-12 | Disposition: A | Discharge status: Home / Self Care | Payer: Medicare Other | Source: Ambulatory Visit | Attending: Cardiovascular Disease | Admitting: Cardiovascular Disease

## 2013-01-14 ENCOUNTER — Encounter (HOSPITAL_COMMUNITY)
Admission: RE | Admit: 2013-01-14 | Discharge: 2013-01-14 | Disposition: A | Discharge status: Home / Self Care | Payer: Medicare Other | Source: Ambulatory Visit | Attending: Cardiovascular Disease | Admitting: Cardiovascular Disease

## 2013-01-16 ENCOUNTER — Encounter (HOSPITAL_COMMUNITY): Payer: Medicare Other

## 2013-01-19 ENCOUNTER — Encounter (HOSPITAL_COMMUNITY): Payer: Medicare Other

## 2013-01-21 ENCOUNTER — Encounter (HOSPITAL_COMMUNITY): Payer: Medicare Other

## 2013-01-23 ENCOUNTER — Encounter (HOSPITAL_COMMUNITY): Payer: Medicare Other

## 2013-01-26 ENCOUNTER — Encounter (HOSPITAL_COMMUNITY)
Admission: RE | Admit: 2013-01-26 | Discharge: 2013-01-26 | Disposition: A | Discharge status: Home / Self Care | Payer: Medicare Other | Source: Ambulatory Visit | Attending: Cardiovascular Disease | Admitting: Cardiovascular Disease

## 2013-01-26 DIAGNOSIS — Z9861 Coronary angioplasty status: Secondary | ICD-10-CM | POA: Diagnosis not present

## 2013-01-26 DIAGNOSIS — I252 Old myocardial infarction: Secondary | ICD-10-CM | POA: Insufficient documentation

## 2013-01-26 DIAGNOSIS — Z5189 Encounter for other specified aftercare: Secondary | ICD-10-CM | POA: Insufficient documentation

## 2013-01-28 ENCOUNTER — Encounter (HOSPITAL_COMMUNITY)
Admission: RE | Admit: 2013-01-28 | Discharge: 2013-01-28 | Disposition: A | Discharge status: Home / Self Care | Payer: Medicare Other | Source: Ambulatory Visit | Attending: Cardiovascular Disease | Admitting: Cardiovascular Disease

## 2013-01-30 ENCOUNTER — Encounter (HOSPITAL_COMMUNITY)
Admission: RE | Admit: 2013-01-30 | Discharge: 2013-01-30 | Disposition: A | Discharge status: Home / Self Care | Payer: Medicare Other | Source: Ambulatory Visit | Attending: Cardiovascular Disease | Admitting: Cardiovascular Disease

## 2013-02-02 ENCOUNTER — Encounter (HOSPITAL_COMMUNITY)
Admission: RE | Admit: 2013-02-02 | Discharge: 2013-02-02 | Disposition: A | Discharge status: Home / Self Care | Payer: Medicare Other | Source: Ambulatory Visit | Attending: Cardiovascular Disease | Admitting: Cardiovascular Disease

## 2013-02-03 ENCOUNTER — Ambulatory Visit (INDEPENDENT_AMBULATORY_CARE_PROVIDER_SITE_OTHER): Payer: Medicare Other | Admitting: Cardiovascular Disease

## 2013-02-03 ENCOUNTER — Encounter: Payer: Self-pay | Admitting: Cardiovascular Disease

## 2013-02-03 VITALS — BP 130/81 | HR 66 | Ht 68.0 in | Wt 216.0 lb

## 2013-02-03 DIAGNOSIS — I10 Essential (primary) hypertension: Secondary | ICD-10-CM

## 2013-02-03 DIAGNOSIS — I2510 Atherosclerotic heart disease of native coronary artery without angina pectoris: Secondary | ICD-10-CM

## 2013-02-03 DIAGNOSIS — E785 Hyperlipidemia, unspecified: Secondary | ICD-10-CM | POA: Diagnosis not present

## 2013-02-03 DIAGNOSIS — I779 Disorder of arteries and arterioles, unspecified: Secondary | ICD-10-CM

## 2013-02-03 NOTE — Progress Notes (Signed)
History of Present Illness: 67 y.o. male with h/o CAD, DM, HTN, early dementia who is here today for cardiac follow up. He was admitted 12/8-12/10/13 with chest pain. Initially presented to [facility] with chest pain. Cardiac markers were abnormal at [facility] but normal at [facility]. ECG demonstrated TWI in the anterior and lateral leads. LHC 02/04/12: pLAD 40%, mLAD 50% (2 mm vessel), AV groove CFX 99% (very small-1.5-1.7 mm), RCA serial 40% mid, distal 50-60%, EF 55%. Medical management recommended. Culprit was likely a very small caliber mid AV groove circumflex lesion but too small for PCI. Echo 02/04/12: EF 50-55%, no wall motion abnormalities, grade 1 diastolic dysfunction, MAC, mild LAE, mild RVE with moderately reduced RV systolic function, mild RAE. Carotid Dopplers 02/05/12: RICA less than 40%. Repeat cath 10/14/12 in setting of NSTEMI, 2.25 x 24 Promus Premier placed in severe stenosis in right posterolateral OM branch.   He is here today for follow up. He feels well. No chest pain or SOB, syncope, orthopnea, PND or significant pedal edema.   Primary Care Physician: Donzetta Sprung  Last Lipid Profile: Followed in primary care.   Past Medical History  Diagnosis Date  . HTN (hypertension)   . Diabetes mellitus type 2, diet-controlled   . Mixed dyslipidemia     Low HDL, elevated TRIG  . CAD (coronary artery disease)     a. NSTEMI 12/13 - LHC AV groove CFX 99% too small for PCI, o/w nonobst dz => med Rx. b. NSTEMI 09/2012: s/p PTCA/DES to PLOM of RCA, known AV groove Cx stenosis, o/w nonobstructive dz for med rx. EF 55-60%.  Marland Kitchen Hx of echocardiogram     a. Echo 02/04/12: EF 50-55%, no wall motion abnormalities, grade 1 diastolic dysfunction, MAC, mild LAE, mild RVE with moderately reduced RV systolic function, mild RAE  . Carotid stenosis     a. dopplers 12/13: RICA < 40%  . Dementia     a. Early dementia, on Exelon. Mother had early onset dementia.  . Bradycardia     a. HR 60's, fatigue  in 05/2012 - beta blocker discontinued.  . Skin cancer     a. Excised from back in 2012.  . Melanoma     a. Exercised from head in 2012, did not spread.  . Sleep apnea     "resolved after losing alot of weight" (10/14/2012)  . Acute renal insufficiency     a. Cr 1.48 in 01/2012, with resolution.  . Kidney stones   . Leukocytosis     a. 09/2012 - for outpt repeat to ensure improvement.    Past Surgical History  Procedure Laterality Date  . Back surgery    . Cardiac catheterization  01/2012  . Coronary angioplasty with stent placement  10/14/2012    "1" (10/14/2012)  . Melanoma excision  ?2012    "top of head" (10/14/2012)  . Skin cancer excision  2012    "back" (10/14/2012)  . Lumbar disc surgery  ?1997    "fixed ruptured disc" (10/14/2012)  . Lithotripsy  1990's    "5 times" (10/14/2012)    Current Outpatient Prescriptions  Medication Sig Dispense Refill  . aspirin (ASPIRIN EC) 81 MG EC tablet Take 81 mg by mouth daily. Swallow whole.      Marland Kitchen atorvastatin (LIPITOR) 80 MG tablet Take 1 tablet (80 mg total) by mouth daily.  30 tablet  11  . clopidogrel (PLAVIX) 75 MG tablet Take 1 tablet (75 mg total) by mouth daily.  30 tablet  11  . isosorbide mononitrate (IMDUR) 30 MG 24 hr tablet Take 1 tablet (30 mg total) by mouth daily.  30 tablet  11  . losartan (COZAAR) 25 MG tablet Take 1 tablet (25 mg total) by mouth daily.  30 tablet  6  . nitroGLYCERIN (NITROSTAT) 0.4 MG SL tablet Place 1 tablet (0.4 mg total) under the tongue every 5 (five) minutes as needed for chest pain (up to 3 doses).      . Rivastigmine 13.3 MG/24HR PT24 Place 1 patch onto the skin daily. exelon patch for memory      . ZOSTAVAX [address] UNT/0.65ML injection        No current facility-administered medications for this visit.    Allergies  Allergen Reactions  . Penicillins Rash    History   Social History  . Marital Status: Married    Spouse Name: N/A    Number of Children: N/A  . Years of Education: N/A    Occupational History  . Not on file.   Social History Main Topics  . Smoking status: Former Smoker -- 0.12 packs/day for 4 years    Types: Cigarettes    Quit date: 02/03/1967  . Smokeless tobacco: Never Used     Comment: As a teen occasionally  . Alcohol Use: Yes     Comment: 10/14/2012 "drank a little in the [employer]; none since"  . Drug Use: No  . Sexual Activity: Yes   Other Topics Concern  . Not on file   Social History Narrative  . No narrative on file    Family History  Problem Relation Age of Onset  . Dementia Mother     Early onset  . CAD Father     MI at 54    Review of Systems:  As stated in the HPI and otherwise negative.   BP 130/81  Pulse 66  Ht 5\' 8"  (1.727 m)  Wt 216 lb (97.977 kg)  BMI 32.85 kg/m2  Physical Examination: General: Well developed, well nourished, NAD HEENT: OP clear, mucus membranes moist SKIN: warm, dry. No rashes. Neuro: No focal deficits Musculoskeletal: Muscle strength 5/5 all ext Psychiatric: Mood and affect normal Neck: No JVD, no carotid bruits, no thyromegaly, no lymphadenopathy. Lungs:Clear bilaterally, no wheezes, rhonci, crackles Cardiovascular: Regular rate and rhythm. No murmurs, gallops or rubs. Abdomen:Soft. Bowel sounds present. Non-tender.  Extremities: No lower extremity edema. Pulses are 2 + in the bilateral DP/PT.  Cardiac cath 10/14/12: Left mainstem: Normal  Left anterior descending (LAD): The LAD is a relatively small vessel with 40% proximal disease. In the mid vessel there is focal 40-50% stenosis.  Left circumflex (LCx): There is 30% narrowing in the proximal vessel. The first OM is normal. After the OM the LCx terminates in a small branch in the AV groove. This branch has a 90% stenosis.  Right coronary artery (RCA): The RCA is a large dominant vessel. It has 30-40% eccentric and irregular plaque in the mid vessel. The PLOM branch of the RCA has a segmental 90-95% stenosis.  Left ventriculography: Left  ventricular systolic function is normal, LVEF is estimated at 55-65%, there is no significant mitral regurgitation  PCI Note: Following the diagnostic procedure, the decision was made to proceed with PCI of the PLOM. Weight-based heparin was given for anticoagulation. Once a therapeutic ACT was achieved, a 6 [place] FR4 guide catheter was inserted. A prowater coronary guidewire was used to cross the lesion. The lesion was predilated with a 2.0  mm balloon. The lesion was then stented with a 2.25 x 24 mm Promus premier stent. Following PCI, there was 0% residual stenosis and TIMI-3 flow. Final angiography confirmed an excellent result. The patient tolerated the procedure well. There were no immediate procedural complications. A TR band was used for radial hemostasis. The patient was transferred to the post catheterization recovery area for further monitoring.   Assessment and Plan:   1. Coronary Artery Disease: Stable. Tolerating all medications. Beta blocker stopped due to fatigue and bradycardia. Continue ASA, Plavix, statin, nitrate. He is in cardiac rehab at [facility].  2. Hyperlipidemia: Continue statin. Lipids followed in primary care  3. Hypertension: Controlled. Continue current therapy.   4. Carotid Stenosis: Mild plaque. Will need f/u dopplers in 2015.    5. Dementia: Per PCP.

## 2013-02-03 NOTE — Patient Instructions (Signed)
Your physician wants you to follow-up in:  6 months. You will receive a reminder letter in the mail two months in advance. If you don't receive a letter, please call our office to schedule the follow-up appointment.   

## 2013-02-04 ENCOUNTER — Encounter (HOSPITAL_COMMUNITY): Payer: Medicare Other

## 2013-02-06 ENCOUNTER — Encounter (HOSPITAL_COMMUNITY)
Admission: RE | Admit: 2013-02-06 | Discharge: 2013-02-06 | Disposition: A | Discharge status: Home / Self Care | Payer: Medicare Other | Source: Ambulatory Visit | Attending: Cardiovascular Disease | Admitting: Cardiovascular Disease

## 2013-02-09 ENCOUNTER — Encounter (HOSPITAL_COMMUNITY): Payer: Medicare Other

## 2013-02-11 ENCOUNTER — Encounter (HOSPITAL_COMMUNITY)
Admission: RE | Admit: 2013-02-11 | Discharge: 2013-02-11 | Disposition: A | Discharge status: Home / Self Care | Payer: Medicare Other | Source: Ambulatory Visit | Attending: Cardiovascular Disease | Admitting: Cardiovascular Disease

## 2013-02-13 ENCOUNTER — Encounter (HOSPITAL_COMMUNITY)
Admission: RE | Admit: 2013-02-13 | Discharge: 2013-02-13 | Disposition: A | Discharge status: Home / Self Care | Payer: Medicare Other | Source: Ambulatory Visit | Attending: Cardiovascular Disease | Admitting: Cardiovascular Disease

## 2013-02-16 ENCOUNTER — Encounter (HOSPITAL_COMMUNITY)
Admission: RE | Admit: 2013-02-16 | Discharge: 2013-02-16 | Disposition: A | Discharge status: Home / Self Care | Payer: Medicare Other | Source: Ambulatory Visit | Attending: Cardiovascular Disease | Admitting: Cardiovascular Disease

## 2013-02-18 ENCOUNTER — Encounter (HOSPITAL_COMMUNITY): Payer: Medicare Other

## 2013-02-20 ENCOUNTER — Encounter (HOSPITAL_COMMUNITY): Payer: Medicare Other

## 2013-02-23 ENCOUNTER — Encounter (HOSPITAL_COMMUNITY)
Admission: RE | Admit: 2013-02-23 | Discharge: 2013-02-23 | Disposition: A | Discharge status: Home / Self Care | Payer: Medicare Other | Source: Ambulatory Visit | Attending: Cardiovascular Disease | Admitting: Cardiovascular Disease

## 2013-02-25 ENCOUNTER — Encounter (HOSPITAL_COMMUNITY)
Admission: RE | Admit: 2013-02-25 | Discharge: 2013-02-25 | Disposition: A | Discharge status: Home / Self Care | Payer: Medicare Other | Source: Ambulatory Visit | Attending: Cardiovascular Disease | Admitting: Cardiovascular Disease

## 2013-02-25 DIAGNOSIS — I10 Essential (primary) hypertension: Secondary | ICD-10-CM | POA: Diagnosis not present

## 2013-02-25 DIAGNOSIS — E782 Mixed hyperlipidemia: Secondary | ICD-10-CM | POA: Diagnosis not present

## 2013-02-26 ENCOUNTER — Other Ambulatory Visit (HOSPITAL_COMMUNITY): Payer: Self-pay | Admitting: Physician Assistant

## 2013-02-27 ENCOUNTER — Encounter (HOSPITAL_COMMUNITY)
Admission: RE | Admit: 2013-02-27 | Discharge: 2013-02-27 | Disposition: A | Discharge status: Home / Self Care | Payer: Medicare Other | Source: Ambulatory Visit | Attending: Cardiovascular Disease | Admitting: Cardiovascular Disease

## 2013-02-27 DIAGNOSIS — Z5189 Encounter for other specified aftercare: Secondary | ICD-10-CM | POA: Diagnosis not present

## 2013-02-27 DIAGNOSIS — I252 Old myocardial infarction: Secondary | ICD-10-CM | POA: Diagnosis not present

## 2013-02-27 DIAGNOSIS — Z9861 Coronary angioplasty status: Secondary | ICD-10-CM | POA: Diagnosis not present

## 2013-03-02 ENCOUNTER — Encounter (HOSPITAL_COMMUNITY): Payer: Medicare Other

## 2013-03-02 ENCOUNTER — Ambulatory Visit (HOSPITAL_COMMUNITY): Payer: Medicare Other | Attending: Cardiovascular Disease

## 2013-03-02 ENCOUNTER — Encounter: Payer: Self-pay | Admitting: Cardiovascular Disease

## 2013-03-02 DIAGNOSIS — E119 Type 2 diabetes mellitus without complications: Secondary | ICD-10-CM | POA: Insufficient documentation

## 2013-03-02 DIAGNOSIS — I10 Essential (primary) hypertension: Secondary | ICD-10-CM | POA: Diagnosis not present

## 2013-03-02 DIAGNOSIS — Z87891 Personal history of nicotine dependence: Secondary | ICD-10-CM | POA: Diagnosis not present

## 2013-03-02 DIAGNOSIS — I2510 Atherosclerotic heart disease of native coronary artery without angina pectoris: Secondary | ICD-10-CM | POA: Insufficient documentation

## 2013-03-02 DIAGNOSIS — I6529 Occlusion and stenosis of unspecified carotid artery: Secondary | ICD-10-CM

## 2013-03-02 DIAGNOSIS — R51 Headache: Secondary | ICD-10-CM | POA: Diagnosis not present

## 2013-03-02 DIAGNOSIS — E785 Hyperlipidemia, unspecified: Secondary | ICD-10-CM | POA: Insufficient documentation

## 2013-03-02 DIAGNOSIS — I658 Occlusion and stenosis of other precerebral arteries: Secondary | ICD-10-CM | POA: Diagnosis not present

## 2013-03-04 ENCOUNTER — Encounter (HOSPITAL_COMMUNITY): Payer: Medicare Other

## 2013-03-04 DIAGNOSIS — Z23 Encounter for immunization: Secondary | ICD-10-CM | POA: Diagnosis not present

## 2013-03-04 DIAGNOSIS — I10 Essential (primary) hypertension: Secondary | ICD-10-CM | POA: Diagnosis not present

## 2013-03-04 DIAGNOSIS — E782 Mixed hyperlipidemia: Secondary | ICD-10-CM | POA: Diagnosis not present

## 2013-03-04 DIAGNOSIS — IMO0001 Reserved for inherently not codable concepts without codable children: Secondary | ICD-10-CM | POA: Diagnosis not present

## 2013-03-04 DIAGNOSIS — N200 Calculus of kidney: Secondary | ICD-10-CM | POA: Diagnosis not present

## 2013-03-04 DIAGNOSIS — F0390 Unspecified dementia without behavioral disturbance: Secondary | ICD-10-CM | POA: Diagnosis not present

## 2013-03-04 DIAGNOSIS — G4733 Obstructive sleep apnea (adult) (pediatric): Secondary | ICD-10-CM | POA: Diagnosis not present

## 2013-03-06 ENCOUNTER — Encounter (HOSPITAL_COMMUNITY)
Admission: RE | Admit: 2013-03-06 | Discharge: 2013-03-06 | Disposition: A | Discharge status: Home / Self Care | Payer: Medicare Other | Source: Ambulatory Visit | Attending: Cardiovascular Disease | Admitting: Cardiovascular Disease

## 2013-03-09 ENCOUNTER — Encounter (HOSPITAL_COMMUNITY)
Admission: RE | Admit: 2013-03-09 | Discharge: 2013-03-09 | Disposition: A | Discharge status: Home / Self Care | Payer: Medicare Other | Source: Ambulatory Visit | Attending: Cardiovascular Disease | Admitting: Cardiovascular Disease

## 2013-03-11 ENCOUNTER — Encounter (HOSPITAL_COMMUNITY)
Admission: RE | Admit: 2013-03-11 | Discharge: 2013-03-11 | Disposition: A | Discharge status: Home / Self Care | Payer: Medicare Other | Source: Ambulatory Visit | Attending: Cardiovascular Disease | Admitting: Cardiovascular Disease

## 2013-03-13 ENCOUNTER — Encounter (HOSPITAL_COMMUNITY)
Admission: RE | Admit: 2013-03-13 | Discharge: 2013-03-13 | Disposition: A | Discharge status: Home / Self Care | Payer: Medicare Other | Source: Ambulatory Visit | Attending: Cardiovascular Disease | Admitting: Cardiovascular Disease

## 2013-03-16 ENCOUNTER — Encounter (HOSPITAL_COMMUNITY)
Admission: RE | Admit: 2013-03-16 | Discharge: 2013-03-16 | Disposition: A | Discharge status: Home / Self Care | Payer: Medicare Other | Source: Ambulatory Visit | Attending: Cardiovascular Disease | Admitting: Cardiovascular Disease

## 2013-03-18 ENCOUNTER — Encounter (HOSPITAL_COMMUNITY)
Admission: RE | Admit: 2013-03-18 | Discharge: 2013-03-18 | Disposition: A | Discharge status: Home / Self Care | Payer: Medicare Other | Source: Ambulatory Visit | Attending: Cardiovascular Disease | Admitting: Cardiovascular Disease

## 2013-03-20 ENCOUNTER — Encounter (HOSPITAL_COMMUNITY)
Admission: RE | Admit: 2013-03-20 | Discharge: 2013-03-20 | Disposition: A | Discharge status: Home / Self Care | Payer: Medicare Other | Source: Ambulatory Visit | Attending: Cardiovascular Disease | Admitting: Cardiovascular Disease

## 2013-03-23 ENCOUNTER — Encounter (HOSPITAL_COMMUNITY): Payer: Medicare Other

## 2013-03-25 ENCOUNTER — Encounter (HOSPITAL_COMMUNITY): Payer: Medicare Other

## 2013-03-25 DIAGNOSIS — J111 Influenza due to unidentified influenza virus with other respiratory manifestations: Secondary | ICD-10-CM | POA: Diagnosis not present

## 2013-03-27 ENCOUNTER — Encounter (HOSPITAL_COMMUNITY): Payer: Medicare Other

## 2013-03-30 ENCOUNTER — Encounter (HOSPITAL_COMMUNITY): Payer: Medicare Other

## 2013-04-01 ENCOUNTER — Encounter (HOSPITAL_COMMUNITY)
Admission: RE | Admit: 2013-04-01 | Discharge: 2013-04-01 | Disposition: A | Discharge status: Home / Self Care | Payer: Medicare Other | Source: Ambulatory Visit | Attending: Cardiovascular Disease | Admitting: Cardiovascular Disease

## 2013-04-01 DIAGNOSIS — Z5189 Encounter for other specified aftercare: Secondary | ICD-10-CM | POA: Insufficient documentation

## 2013-04-01 DIAGNOSIS — Z9861 Coronary angioplasty status: Secondary | ICD-10-CM | POA: Insufficient documentation

## 2013-04-01 DIAGNOSIS — I252 Old myocardial infarction: Secondary | ICD-10-CM | POA: Insufficient documentation

## 2013-04-03 ENCOUNTER — Encounter (HOSPITAL_COMMUNITY)
Admission: RE | Admit: 2013-04-03 | Discharge: 2013-04-03 | Disposition: A | Discharge status: Home / Self Care | Payer: Medicare Other | Source: Ambulatory Visit | Attending: Cardiovascular Disease | Admitting: Cardiovascular Disease

## 2013-04-06 ENCOUNTER — Encounter (HOSPITAL_COMMUNITY): Payer: Medicare Other

## 2013-04-07 NOTE — Addendum Note (Signed)
Encounter addended by: Norlene Duel, RN on: 04/07/2013  9:49 AM<BR>     Documentation filed: Clinical Notes

## 2013-04-07 NOTE — Addendum Note (Signed)
Encounter addended by: Norlene Duel, RN on: 04/07/2013  9:58 AM<BR>     Documentation filed: Clinical Notes

## 2013-04-07 NOTE — Progress Notes (Signed)
Cardiac Rehabilitation Program Outcomes Report   Orientation:  12/11/2012 Halfway report: 02/02/2013 Graduate Date:  tbd Discharge Date:  tbd # of sessions completed: 18  Cardiologist: Angelena Form Family MD:  Gar Ponto Class Time:  06:45  A.  Exercise Program:  Tolerates exercise @ 3.85 METS for 15 minutes  B.  Mental Health:  Good mental attitude  C.  Education/Instruction/Skills  Accurately checks own pulse.  Rest:  53  Exercise: 88, Knows THR for exercise and Uses Perceived Exertion Scale and/or Dyspnea Scale  Uses Perceived Exertion Scale and/or Dyspnea Scale  D.  Nutrition/Weight Control/Body Composition:  Adherence to prescribed nutrition program: good    E.  Blood Lipids    Lab Results  Component Value Date   CHOL 164 02/04/2012   HDL 27* 02/04/2012   LDLCALC 83 02/04/2012   TRIG 269* 02/04/2012   CHOLHDL 6.1 02/04/2012    F.  Lifestyle Changes:  Making positive lifestyle changes and Not smoking:  Quit 1968  G.  Symptoms noted with exercise:  Asymptomatic  Report Completed By:  Oletta Lamas. Sandar Krinke RN   Comments:  This is patients halfway report He has done well in Rehab. HE achieved a peak METS of 3.85. His resting HR was 53 and resting BP was 130/70, His peak HR was 88 and peak BP was 148/80. A report will follow upon his 36th session his graduation.

## 2013-04-07 NOTE — Progress Notes (Signed)
Cardiac Rehabilitation Program Outcomes Report   Orientation:  12/11/2012 1st week: 12/19/2012 Graduate Date:  tbd Discharge Date:  tbd # of sessions completed: 3 DX: NSTEMI/  Stent  Cardiologist: Angelena Form Family MD:  Kern Alberta Class Time:  06:45  A.  Exercise Program:  Tolerates exercise @ 3.85 METS for 15 minutes and Walk Test Results:  Pre: Pre walk Test: Resting HR 60, BP 114/60, O2 98%, RPE 9 and RPD 6, 6 min HR 80, BP 130/80, O2 92% RPE 9 and RPD 10, Post HR 60, BP 92/70, O2 98%, RPE 9 and RPD9. walked 1250 feet at 2.5 mph at 2.95 METS.  B.  Mental Health:  Good mental attitude  C.  Education/Instruction/Skills  Accurately checks own pulse.  Rest:  51  Exercise:  76, Knows THR for exercise and Uses Perceived Exertion Scale and/or Dyspnea Scale  Uses Perceived Exertion Scale and/or Dyspnea Scale  D.  Nutrition/Weight Control/Body Composition:  Adherence to prescribed nutrition program: good    E.  Blood Lipids    Lab Results  Component Value Date   CHOL 164 02/04/2012   HDL 27* 02/04/2012   LDLCALC 83 02/04/2012   TRIG 269* 02/04/2012   CHOLHDL 6.1 02/04/2012    F.  Lifestyle Changes:  Making positive lifestyle changes and Not smoking:  Quit 1968  G.  Symptoms noted with exercise:  Asymptomatic  Report Completed By:  Oletta Lamas. Austine Wiedeman RN   Comments:  This is patients 1st week report. He achieved a peak METS of 3.85. His resting HR was 51 and resting BP was 130/70 and peak HR was 78 and peak BP was 128/70. He has done well his first week. A halfway visit will follow upon pt's 18th visit.

## 2013-04-07 NOTE — Addendum Note (Signed)
Encounter addended by: Norlene Duel, RN on: 04/07/2013  9:48 AM<BR>     Documentation filed: Notes Section

## 2013-04-07 NOTE — Addendum Note (Signed)
Encounter addended by: Norlene Duel, RN on: 04/07/2013  9:58 AM<BR>     Documentation filed: Notes Section

## 2013-04-07 NOTE — Progress Notes (Signed)
Cardiac Rehabilitation Program Outcomes Report   Orientation:  12/11/2012 Graduate Date:  04/03/2013 Discharge Date:  04/03/2013 # of sessions completed: 34 [id number]/ Stent  Cardiologist: Angelena Form Family MD:  Gar Ponto Class Time:  06:45  A.  Exercise Program:  Tolerates exercise @ 2.60 METS for 15 minutes, Walk Test Results:  Post: Post walk test not performed Patient could not stay after class had an appt. and Discharged to home exercise program.  Anticipated compliance:  good  B.  Mental Health:  Good mental attitude  C.  Education/Instruction/Skills  Accurately checks own pulse.  Rest:  70  Exercise:  75, Knows THR for exercise, Uses Perceived Exertion Scale and/or Dyspnea Scale and Attended 10 education classes  Uses Perceived Exertion Scale and/or Dyspnea Scale  D.  Nutrition/Weight Control/Body Composition:  Adherence to prescribed nutrition program: good    E.  Blood Lipids    Lab Results  Component Value Date   CHOL 164 02/04/2012   HDL 27* 02/04/2012   LDLCALC 83 02/04/2012   TRIG 269* 02/04/2012   CHOLHDL 6.1 02/04/2012    F.  Lifestyle Changes:  Making positive lifestyle changes and Not smoking:  Quit 1968  G.  Symptoms noted with exercise:  Asymptomatic  Report Completed By:  Oletta Lamas. Athalee Esterline RN   Comments:  This is patients graduation report. Patient decided to graduate with 34 sessions of 36 due to his job. He achieved a peak METS of 2.60. His resting HR was 70 and resting BP was 140/80 and peak HR was 75 and peak BP was 140/80. A F/U call will be made to patient upon his 1 month, 6 month and 1 year mark to assure compliance.

## 2013-07-01 DIAGNOSIS — F0280 Dementia in other diseases classified elsewhere without behavioral disturbance: Secondary | ICD-10-CM | POA: Diagnosis not present

## 2013-07-01 DIAGNOSIS — G309 Alzheimer's disease, unspecified: Secondary | ICD-10-CM | POA: Diagnosis not present

## 2013-07-01 DIAGNOSIS — I10 Essential (primary) hypertension: Secondary | ICD-10-CM | POA: Diagnosis not present

## 2013-07-01 DIAGNOSIS — G4733 Obstructive sleep apnea (adult) (pediatric): Secondary | ICD-10-CM | POA: Diagnosis not present

## 2013-07-01 DIAGNOSIS — R5381 Other malaise: Secondary | ICD-10-CM | POA: Diagnosis not present

## 2013-07-01 DIAGNOSIS — Z23 Encounter for immunization: Secondary | ICD-10-CM | POA: Diagnosis not present

## 2013-07-01 DIAGNOSIS — N200 Calculus of kidney: Secondary | ICD-10-CM | POA: Diagnosis not present

## 2013-07-01 DIAGNOSIS — R5383 Other fatigue: Secondary | ICD-10-CM | POA: Diagnosis not present

## 2013-07-01 DIAGNOSIS — IMO0001 Reserved for inherently not codable concepts without codable children: Secondary | ICD-10-CM | POA: Diagnosis not present

## 2013-07-01 DIAGNOSIS — E782 Mixed hyperlipidemia: Secondary | ICD-10-CM | POA: Diagnosis not present

## 2013-09-15 ENCOUNTER — Other Ambulatory Visit (HOSPITAL_COMMUNITY): Payer: Self-pay | Admitting: Cardiovascular Disease

## 2013-10-27 DIAGNOSIS — IMO0001 Reserved for inherently not codable concepts without codable children: Secondary | ICD-10-CM | POA: Diagnosis not present

## 2013-10-27 DIAGNOSIS — R5381 Other malaise: Secondary | ICD-10-CM | POA: Diagnosis not present

## 2013-10-27 DIAGNOSIS — E782 Mixed hyperlipidemia: Secondary | ICD-10-CM | POA: Diagnosis not present

## 2013-10-27 DIAGNOSIS — I10 Essential (primary) hypertension: Secondary | ICD-10-CM | POA: Diagnosis not present

## 2013-11-03 DIAGNOSIS — IMO0001 Reserved for inherently not codable concepts without codable children: Secondary | ICD-10-CM | POA: Diagnosis not present

## 2013-11-03 DIAGNOSIS — G309 Alzheimer's disease, unspecified: Secondary | ICD-10-CM | POA: Diagnosis not present

## 2013-11-03 DIAGNOSIS — G4733 Obstructive sleep apnea (adult) (pediatric): Secondary | ICD-10-CM | POA: Diagnosis not present

## 2013-11-03 DIAGNOSIS — F0280 Dementia in other diseases classified elsewhere without behavioral disturbance: Secondary | ICD-10-CM | POA: Diagnosis not present

## 2013-11-03 DIAGNOSIS — I2510 Atherosclerotic heart disease of native coronary artery without angina pectoris: Secondary | ICD-10-CM | POA: Diagnosis not present

## 2013-11-03 DIAGNOSIS — E782 Mixed hyperlipidemia: Secondary | ICD-10-CM | POA: Diagnosis not present

## 2013-11-03 DIAGNOSIS — Z23 Encounter for immunization: Secondary | ICD-10-CM | POA: Diagnosis not present

## 2013-12-08 DIAGNOSIS — Z85828 Personal history of other malignant neoplasm of skin: Secondary | ICD-10-CM | POA: Diagnosis not present

## 2013-12-08 DIAGNOSIS — L570 Actinic keratosis: Secondary | ICD-10-CM | POA: Diagnosis not present

## 2014-02-04 ENCOUNTER — Encounter (HOSPITAL_COMMUNITY): Payer: Self-pay | Admitting: Cardiovascular Disease

## 2014-03-05 DIAGNOSIS — E782 Mixed hyperlipidemia: Secondary | ICD-10-CM | POA: Diagnosis not present

## 2014-03-05 DIAGNOSIS — I10 Essential (primary) hypertension: Secondary | ICD-10-CM | POA: Diagnosis not present

## 2014-03-05 DIAGNOSIS — E119 Type 2 diabetes mellitus without complications: Secondary | ICD-10-CM | POA: Diagnosis not present

## 2014-03-05 DIAGNOSIS — G300 Alzheimer's disease with early onset: Secondary | ICD-10-CM | POA: Diagnosis not present

## 2014-03-12 DIAGNOSIS — I2510 Atherosclerotic heart disease of native coronary artery without angina pectoris: Secondary | ICD-10-CM | POA: Diagnosis not present

## 2014-03-12 DIAGNOSIS — Z1389 Encounter for screening for other disorder: Secondary | ICD-10-CM | POA: Diagnosis not present

## 2014-03-12 DIAGNOSIS — G4733 Obstructive sleep apnea (adult) (pediatric): Secondary | ICD-10-CM | POA: Diagnosis not present

## 2014-03-12 DIAGNOSIS — G300 Alzheimer's disease with early onset: Secondary | ICD-10-CM | POA: Diagnosis not present

## 2014-03-12 DIAGNOSIS — E875 Hyperkalemia: Secondary | ICD-10-CM | POA: Diagnosis not present

## 2014-03-12 DIAGNOSIS — E782 Mixed hyperlipidemia: Secondary | ICD-10-CM | POA: Diagnosis not present

## 2014-03-12 DIAGNOSIS — Z9189 Other specified personal risk factors, not elsewhere classified: Secondary | ICD-10-CM | POA: Diagnosis not present

## 2014-03-12 DIAGNOSIS — I10 Essential (primary) hypertension: Secondary | ICD-10-CM | POA: Diagnosis not present

## 2014-03-15 ENCOUNTER — Other Ambulatory Visit: Payer: Self-pay | Admitting: Cardiovascular Disease

## 2014-03-30 DIAGNOSIS — L03031 Cellulitis of right toe: Secondary | ICD-10-CM | POA: Diagnosis not present

## 2014-05-27 IMAGING — CR DG CHEST 2V
2 series · 2 of 2 positions shown · non-contrast
Comparison: None

CLINICAL DATA: Chest pain

CHEST - 2 VIEW

[w chest pa]
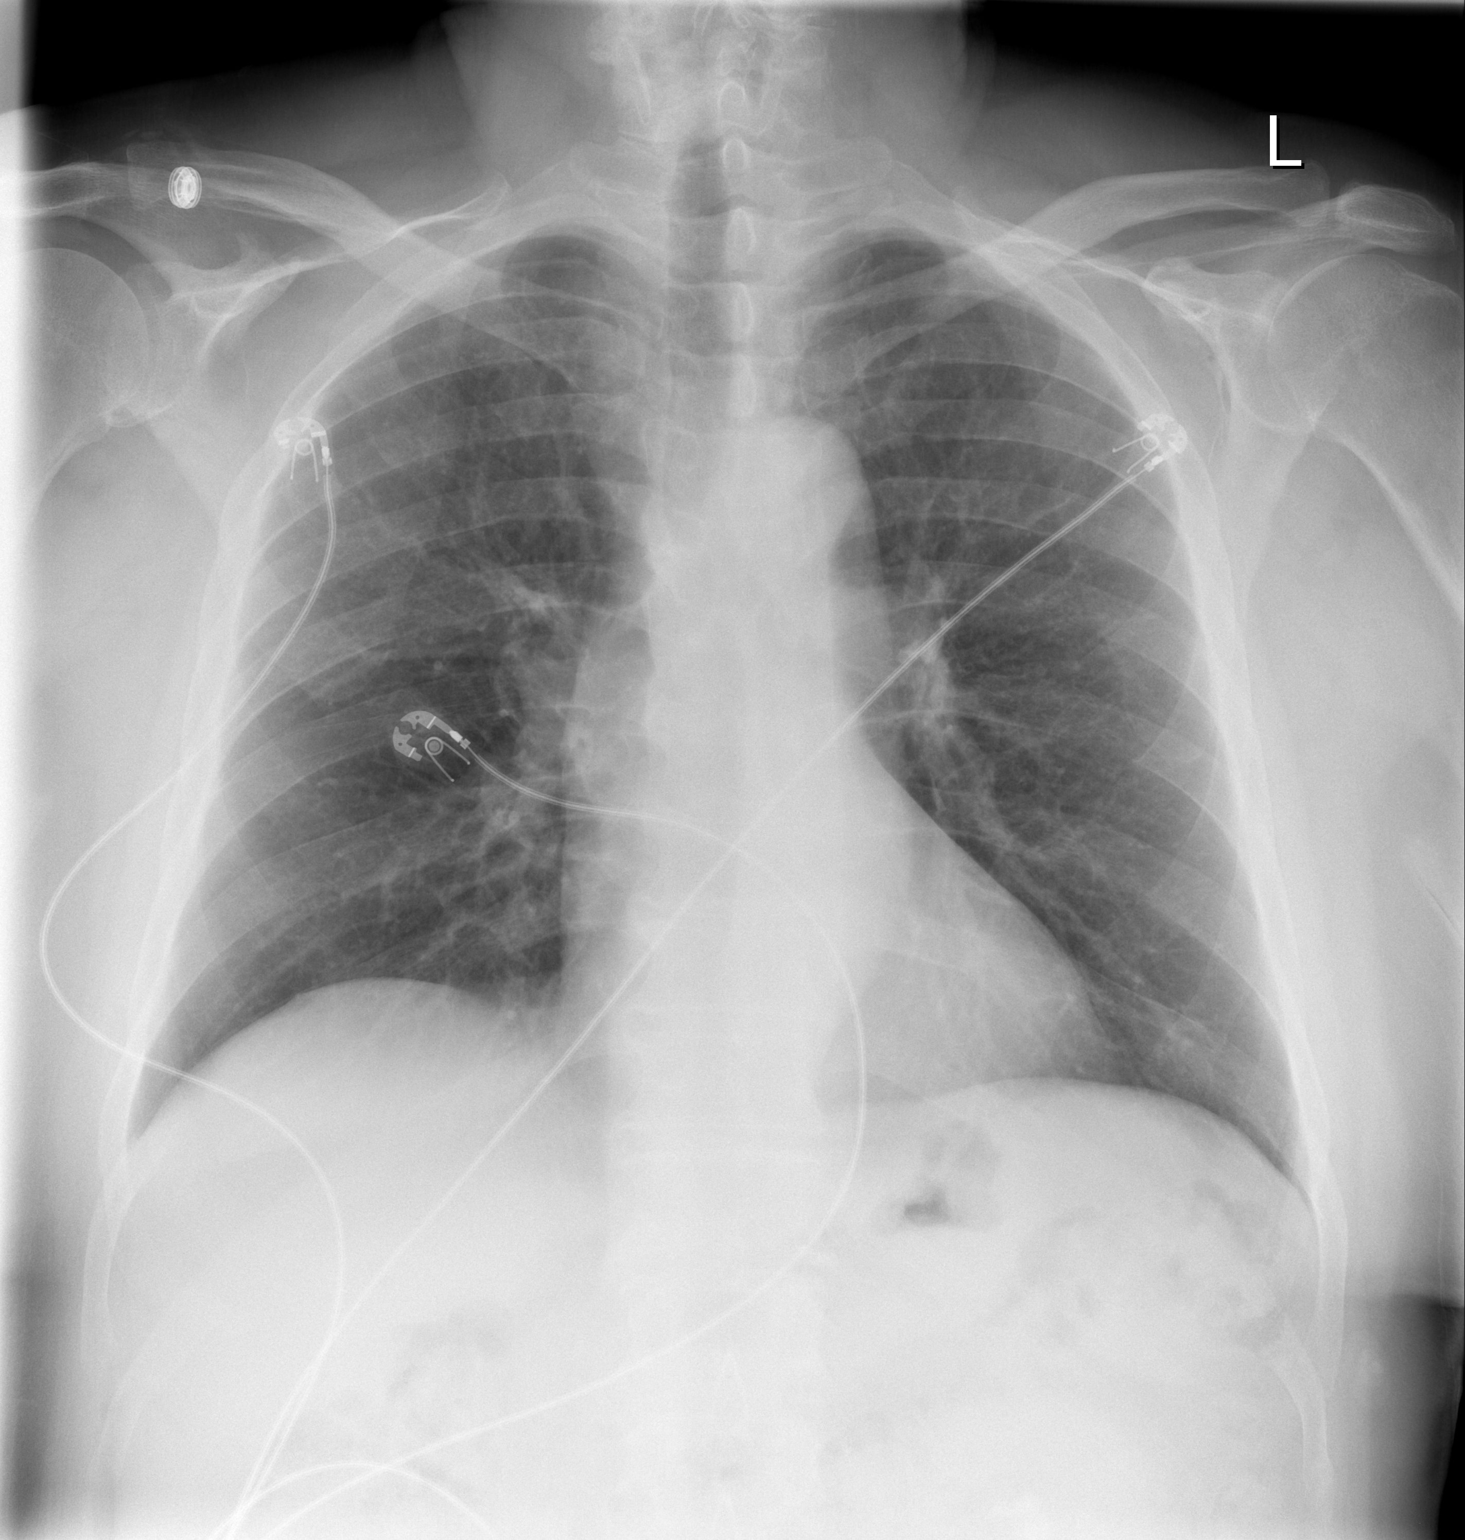

[w chest lat]
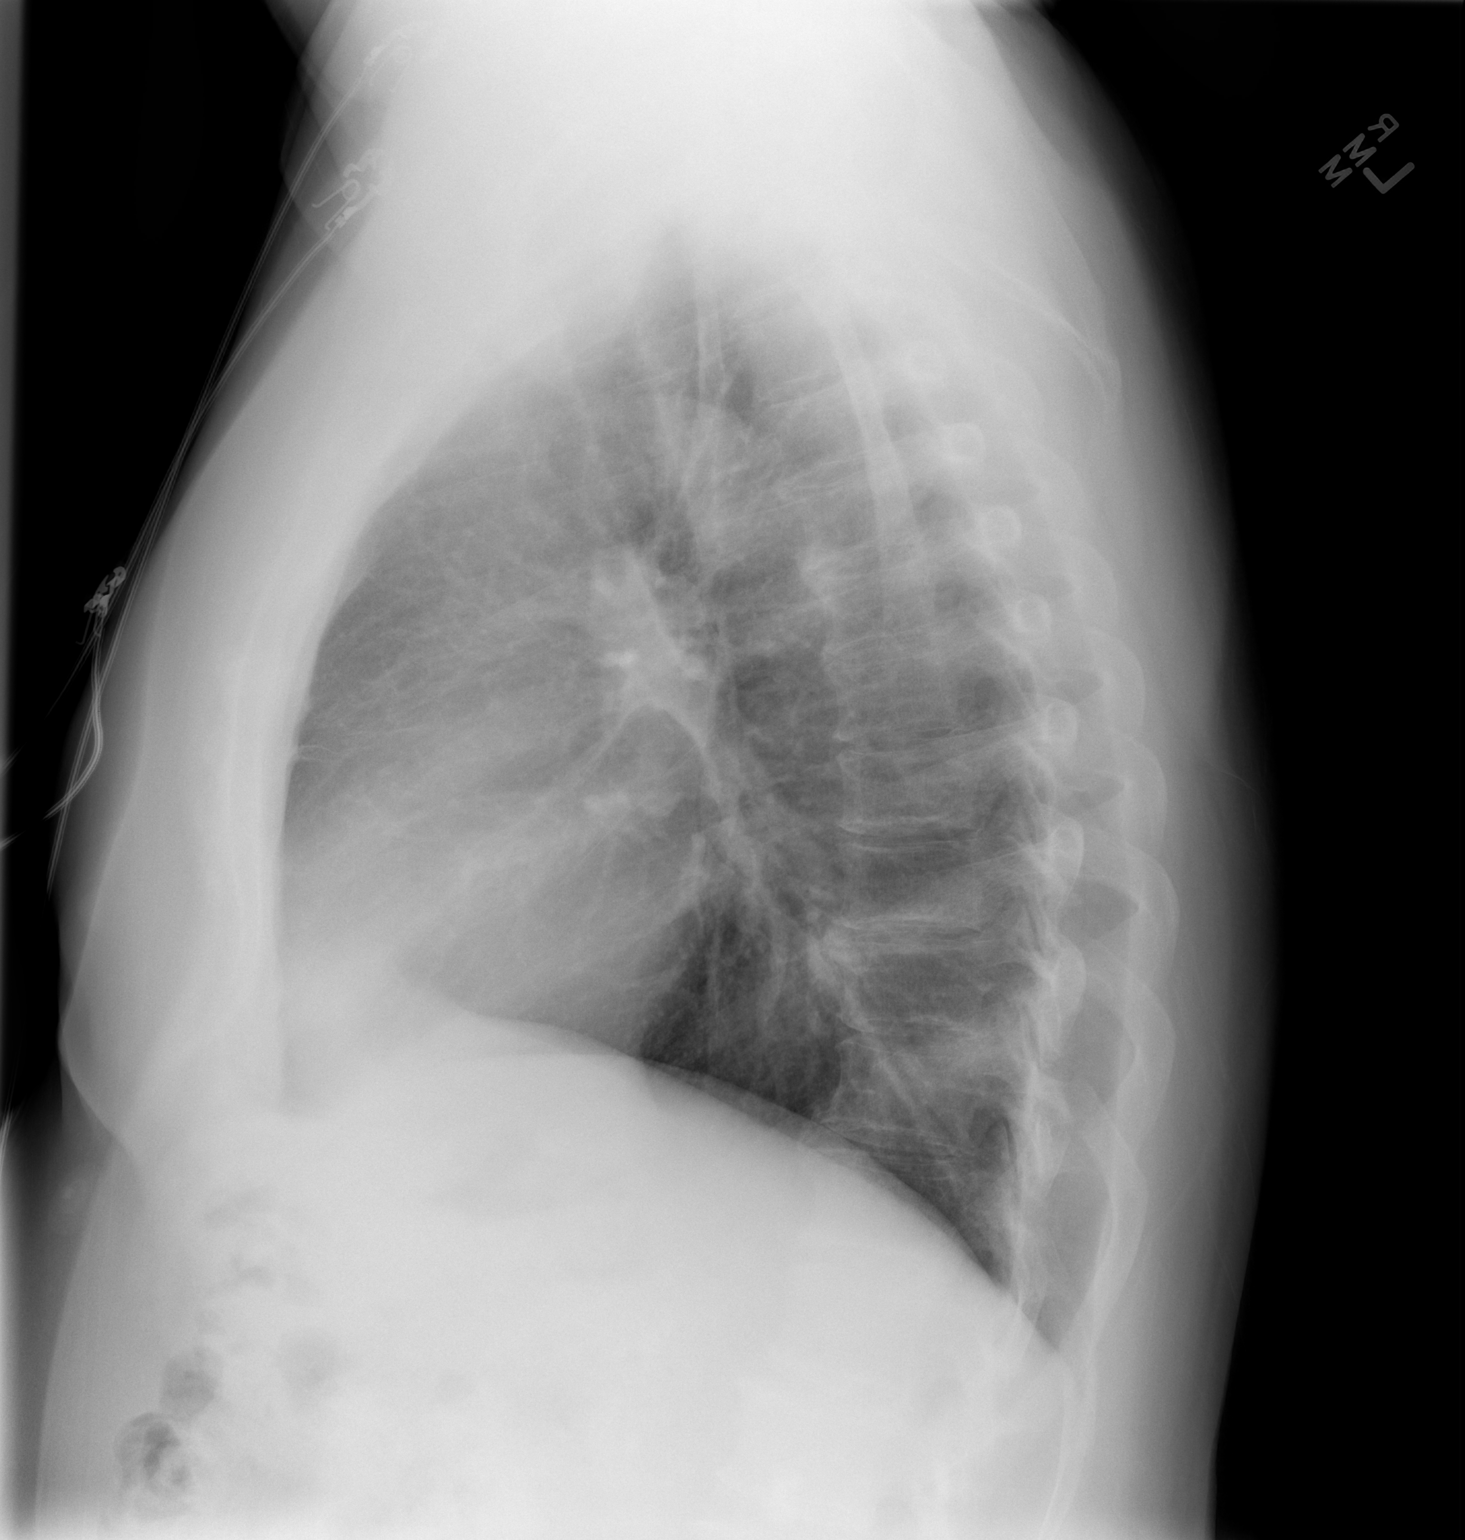

[2 of 2 positions shown; findings below may reference images not displayed]

FINDINGS: Heart size and vascularity are normal.  Negative for
heart failure.  Negative for infiltrate effusion or mass.  Negative
for pneumonia.
IMPRESSION: No acute abnormality.

## 2014-07-13 DIAGNOSIS — E119 Type 2 diabetes mellitus without complications: Secondary | ICD-10-CM | POA: Diagnosis not present

## 2014-07-13 DIAGNOSIS — E782 Mixed hyperlipidemia: Secondary | ICD-10-CM | POA: Diagnosis not present

## 2014-07-13 DIAGNOSIS — I10 Essential (primary) hypertension: Secondary | ICD-10-CM | POA: Diagnosis not present

## 2014-07-13 DIAGNOSIS — E875 Hyperkalemia: Secondary | ICD-10-CM | POA: Diagnosis not present

## 2014-07-13 DIAGNOSIS — I2510 Atherosclerotic heart disease of native coronary artery without angina pectoris: Secondary | ICD-10-CM | POA: Diagnosis not present

## 2014-07-13 DIAGNOSIS — G300 Alzheimer's disease with early onset: Secondary | ICD-10-CM | POA: Diagnosis not present

## 2014-07-13 DIAGNOSIS — G4733 Obstructive sleep apnea (adult) (pediatric): Secondary | ICD-10-CM | POA: Diagnosis not present

## 2014-08-23 DIAGNOSIS — L03031 Cellulitis of right toe: Secondary | ICD-10-CM | POA: Diagnosis not present

## 2014-10-27 DIAGNOSIS — L03031 Cellulitis of right toe: Secondary | ICD-10-CM | POA: Diagnosis not present

## 2014-11-09 DIAGNOSIS — I25111 Atherosclerotic heart disease of native coronary artery with angina pectoris with documented spasm: Secondary | ICD-10-CM | POA: Diagnosis not present

## 2014-11-09 DIAGNOSIS — Z23 Encounter for immunization: Secondary | ICD-10-CM | POA: Diagnosis not present

## 2014-11-09 DIAGNOSIS — G300 Alzheimer's disease with early onset: Secondary | ICD-10-CM | POA: Diagnosis not present

## 2014-11-09 DIAGNOSIS — G4733 Obstructive sleep apnea (adult) (pediatric): Secondary | ICD-10-CM | POA: Diagnosis not present

## 2014-11-09 DIAGNOSIS — N182 Chronic kidney disease, stage 2 (mild): Secondary | ICD-10-CM | POA: Diagnosis not present

## 2014-11-09 DIAGNOSIS — E538 Deficiency of other specified B group vitamins: Secondary | ICD-10-CM | POA: Diagnosis not present

## 2014-11-09 DIAGNOSIS — I10 Essential (primary) hypertension: Secondary | ICD-10-CM | POA: Diagnosis not present

## 2014-11-09 DIAGNOSIS — E1122 Type 2 diabetes mellitus with diabetic chronic kidney disease: Secondary | ICD-10-CM | POA: Diagnosis not present

## 2014-11-09 DIAGNOSIS — E782 Mixed hyperlipidemia: Secondary | ICD-10-CM | POA: Diagnosis not present

## 2014-11-25 DIAGNOSIS — H4011X4 Primary open-angle glaucoma, indeterminate stage: Secondary | ICD-10-CM | POA: Diagnosis not present

## 2014-11-25 DIAGNOSIS — E119 Type 2 diabetes mellitus without complications: Secondary | ICD-10-CM | POA: Diagnosis not present

## 2014-11-25 DIAGNOSIS — H25813 Combined forms of age-related cataract, bilateral: Secondary | ICD-10-CM | POA: Diagnosis not present

## 2014-11-25 DIAGNOSIS — H524 Presbyopia: Secondary | ICD-10-CM | POA: Diagnosis not present

## 2014-12-06 DIAGNOSIS — H25811 Combined forms of age-related cataract, right eye: Secondary | ICD-10-CM | POA: Diagnosis not present

## 2014-12-07 DIAGNOSIS — L570 Actinic keratosis: Secondary | ICD-10-CM | POA: Diagnosis not present

## 2014-12-17 DIAGNOSIS — H401130 Primary open-angle glaucoma, bilateral, stage unspecified: Secondary | ICD-10-CM | POA: Diagnosis not present

## 2014-12-17 DIAGNOSIS — I10 Essential (primary) hypertension: Secondary | ICD-10-CM | POA: Diagnosis not present

## 2014-12-17 DIAGNOSIS — G309 Alzheimer's disease, unspecified: Secondary | ICD-10-CM | POA: Diagnosis not present

## 2014-12-17 DIAGNOSIS — Z7984 Long term (current) use of oral hypoglycemic drugs: Secondary | ICD-10-CM | POA: Diagnosis not present

## 2014-12-17 DIAGNOSIS — Z87891 Personal history of nicotine dependence: Secondary | ICD-10-CM | POA: Diagnosis not present

## 2014-12-17 DIAGNOSIS — H25811 Combined forms of age-related cataract, right eye: Secondary | ICD-10-CM | POA: Diagnosis not present

## 2014-12-17 DIAGNOSIS — F0280 Dementia in other diseases classified elsewhere without behavioral disturbance: Secondary | ICD-10-CM | POA: Diagnosis not present

## 2014-12-17 DIAGNOSIS — E119 Type 2 diabetes mellitus without complications: Secondary | ICD-10-CM | POA: Diagnosis not present

## 2014-12-17 DIAGNOSIS — I252 Old myocardial infarction: Secondary | ICD-10-CM | POA: Diagnosis not present

## 2015-03-08 DIAGNOSIS — E1122 Type 2 diabetes mellitus with diabetic chronic kidney disease: Secondary | ICD-10-CM | POA: Diagnosis not present

## 2015-03-08 DIAGNOSIS — E875 Hyperkalemia: Secondary | ICD-10-CM | POA: Diagnosis not present

## 2015-03-08 DIAGNOSIS — E782 Mixed hyperlipidemia: Secondary | ICD-10-CM | POA: Diagnosis not present

## 2015-03-08 DIAGNOSIS — I10 Essential (primary) hypertension: Secondary | ICD-10-CM | POA: Diagnosis not present

## 2015-03-17 DIAGNOSIS — G300 Alzheimer's disease with early onset: Secondary | ICD-10-CM | POA: Diagnosis not present

## 2015-03-17 DIAGNOSIS — L03031 Cellulitis of right toe: Secondary | ICD-10-CM | POA: Diagnosis not present

## 2015-03-17 DIAGNOSIS — N182 Chronic kidney disease, stage 2 (mild): Secondary | ICD-10-CM | POA: Diagnosis not present

## 2015-03-17 DIAGNOSIS — I10 Essential (primary) hypertension: Secondary | ICD-10-CM | POA: Diagnosis not present

## 2015-03-17 DIAGNOSIS — I2510 Atherosclerotic heart disease of native coronary artery without angina pectoris: Secondary | ICD-10-CM | POA: Diagnosis not present

## 2015-03-17 DIAGNOSIS — E782 Mixed hyperlipidemia: Secondary | ICD-10-CM | POA: Diagnosis not present

## 2015-03-17 DIAGNOSIS — G4733 Obstructive sleep apnea (adult) (pediatric): Secondary | ICD-10-CM | POA: Diagnosis not present

## 2015-03-17 DIAGNOSIS — E1122 Type 2 diabetes mellitus with diabetic chronic kidney disease: Secondary | ICD-10-CM | POA: Diagnosis not present

## 2015-07-04 DIAGNOSIS — E119 Type 2 diabetes mellitus without complications: Secondary | ICD-10-CM | POA: Diagnosis not present

## 2015-07-04 DIAGNOSIS — H25812 Combined forms of age-related cataract, left eye: Secondary | ICD-10-CM | POA: Diagnosis not present

## 2015-07-04 DIAGNOSIS — H401113 Primary open-angle glaucoma, right eye, severe stage: Secondary | ICD-10-CM | POA: Diagnosis not present

## 2015-07-04 DIAGNOSIS — H401122 Primary open-angle glaucoma, left eye, moderate stage: Secondary | ICD-10-CM | POA: Diagnosis not present

## 2015-07-20 DIAGNOSIS — H25812 Combined forms of age-related cataract, left eye: Secondary | ICD-10-CM | POA: Diagnosis not present

## 2015-07-28 DIAGNOSIS — N182 Chronic kidney disease, stage 2 (mild): Secondary | ICD-10-CM | POA: Diagnosis not present

## 2015-07-28 DIAGNOSIS — E875 Hyperkalemia: Secondary | ICD-10-CM | POA: Diagnosis not present

## 2015-07-28 DIAGNOSIS — E782 Mixed hyperlipidemia: Secondary | ICD-10-CM | POA: Diagnosis not present

## 2015-07-28 DIAGNOSIS — I10 Essential (primary) hypertension: Secondary | ICD-10-CM | POA: Diagnosis not present

## 2015-07-28 DIAGNOSIS — E119 Type 2 diabetes mellitus without complications: Secondary | ICD-10-CM | POA: Diagnosis not present

## 2015-07-29 DIAGNOSIS — I2510 Atherosclerotic heart disease of native coronary artery without angina pectoris: Secondary | ICD-10-CM | POA: Diagnosis not present

## 2015-07-29 DIAGNOSIS — E782 Mixed hyperlipidemia: Secondary | ICD-10-CM | POA: Diagnosis not present

## 2015-07-29 DIAGNOSIS — G300 Alzheimer's disease with early onset: Secondary | ICD-10-CM | POA: Diagnosis not present

## 2015-07-29 DIAGNOSIS — E1122 Type 2 diabetes mellitus with diabetic chronic kidney disease: Secondary | ICD-10-CM | POA: Diagnosis not present

## 2015-07-29 DIAGNOSIS — I10 Essential (primary) hypertension: Secondary | ICD-10-CM | POA: Diagnosis not present

## 2015-07-29 DIAGNOSIS — N182 Chronic kidney disease, stage 2 (mild): Secondary | ICD-10-CM | POA: Diagnosis not present

## 2015-07-29 DIAGNOSIS — G4733 Obstructive sleep apnea (adult) (pediatric): Secondary | ICD-10-CM | POA: Diagnosis not present

## 2015-08-04 DIAGNOSIS — E119 Type 2 diabetes mellitus without complications: Secondary | ICD-10-CM | POA: Diagnosis not present

## 2015-08-04 DIAGNOSIS — Z961 Presence of intraocular lens: Secondary | ICD-10-CM | POA: Diagnosis not present

## 2015-08-04 DIAGNOSIS — H401130 Primary open-angle glaucoma, bilateral, stage unspecified: Secondary | ICD-10-CM | POA: Diagnosis not present

## 2015-08-04 DIAGNOSIS — Z9841 Cataract extraction status, right eye: Secondary | ICD-10-CM | POA: Diagnosis not present

## 2015-08-04 DIAGNOSIS — Z7982 Long term (current) use of aspirin: Secondary | ICD-10-CM | POA: Diagnosis not present

## 2015-08-04 DIAGNOSIS — Z87891 Personal history of nicotine dependence: Secondary | ICD-10-CM | POA: Diagnosis not present

## 2015-08-04 DIAGNOSIS — G309 Alzheimer's disease, unspecified: Secondary | ICD-10-CM | POA: Diagnosis not present

## 2015-08-04 DIAGNOSIS — F0280 Dementia in other diseases classified elsewhere without behavioral disturbance: Secondary | ICD-10-CM | POA: Diagnosis not present

## 2015-08-04 DIAGNOSIS — Z7984 Long term (current) use of oral hypoglycemic drugs: Secondary | ICD-10-CM | POA: Diagnosis not present

## 2015-08-04 DIAGNOSIS — H2512 Age-related nuclear cataract, left eye: Secondary | ICD-10-CM | POA: Diagnosis not present

## 2015-08-04 DIAGNOSIS — I10 Essential (primary) hypertension: Secondary | ICD-10-CM | POA: Diagnosis not present

## 2015-08-04 DIAGNOSIS — I252 Old myocardial infarction: Secondary | ICD-10-CM | POA: Diagnosis not present

## 2015-08-04 DIAGNOSIS — H25812 Combined forms of age-related cataract, left eye: Secondary | ICD-10-CM | POA: Diagnosis not present

## 2015-11-29 DIAGNOSIS — E1122 Type 2 diabetes mellitus with diabetic chronic kidney disease: Secondary | ICD-10-CM | POA: Diagnosis not present

## 2015-11-29 DIAGNOSIS — E538 Deficiency of other specified B group vitamins: Secondary | ICD-10-CM | POA: Diagnosis not present

## 2015-11-29 DIAGNOSIS — G300 Alzheimer's disease with early onset: Secondary | ICD-10-CM | POA: Diagnosis not present

## 2015-11-29 DIAGNOSIS — G4733 Obstructive sleep apnea (adult) (pediatric): Secondary | ICD-10-CM | POA: Diagnosis not present

## 2015-11-29 DIAGNOSIS — I25111 Atherosclerotic heart disease of native coronary artery with angina pectoris with documented spasm: Secondary | ICD-10-CM | POA: Diagnosis not present

## 2015-11-29 DIAGNOSIS — E782 Mixed hyperlipidemia: Secondary | ICD-10-CM | POA: Diagnosis not present

## 2015-11-29 DIAGNOSIS — E875 Hyperkalemia: Secondary | ICD-10-CM | POA: Diagnosis not present

## 2015-11-29 DIAGNOSIS — N182 Chronic kidney disease, stage 2 (mild): Secondary | ICD-10-CM | POA: Diagnosis not present

## 2015-12-01 DIAGNOSIS — E1122 Type 2 diabetes mellitus with diabetic chronic kidney disease: Secondary | ICD-10-CM | POA: Diagnosis not present

## 2015-12-01 DIAGNOSIS — Z6834 Body mass index (BMI) 34.0-34.9, adult: Secondary | ICD-10-CM | POA: Diagnosis not present

## 2015-12-01 DIAGNOSIS — G300 Alzheimer's disease with early onset: Secondary | ICD-10-CM | POA: Diagnosis not present

## 2015-12-01 DIAGNOSIS — I10 Essential (primary) hypertension: Secondary | ICD-10-CM | POA: Diagnosis not present

## 2015-12-01 DIAGNOSIS — Z1389 Encounter for screening for other disorder: Secondary | ICD-10-CM | POA: Diagnosis not present

## 2015-12-01 DIAGNOSIS — E782 Mixed hyperlipidemia: Secondary | ICD-10-CM | POA: Diagnosis not present

## 2015-12-01 DIAGNOSIS — G4733 Obstructive sleep apnea (adult) (pediatric): Secondary | ICD-10-CM | POA: Diagnosis not present

## 2015-12-01 DIAGNOSIS — Z23 Encounter for immunization: Secondary | ICD-10-CM | POA: Diagnosis not present

## 2015-12-07 DIAGNOSIS — Z85828 Personal history of other malignant neoplasm of skin: Secondary | ICD-10-CM | POA: Diagnosis not present

## 2015-12-07 DIAGNOSIS — D485 Neoplasm of uncertain behavior of skin: Secondary | ICD-10-CM | POA: Diagnosis not present

## 2015-12-07 DIAGNOSIS — L570 Actinic keratosis: Secondary | ICD-10-CM | POA: Diagnosis not present

## 2015-12-28 DIAGNOSIS — M47816 Spondylosis without myelopathy or radiculopathy, lumbar region: Secondary | ICD-10-CM | POA: Diagnosis not present

## 2015-12-28 DIAGNOSIS — M546 Pain in thoracic spine: Secondary | ICD-10-CM | POA: Diagnosis not present

## 2015-12-28 DIAGNOSIS — M9902 Segmental and somatic dysfunction of thoracic region: Secondary | ICD-10-CM | POA: Diagnosis not present

## 2015-12-28 DIAGNOSIS — M5442 Lumbago with sciatica, left side: Secondary | ICD-10-CM | POA: Diagnosis not present

## 2015-12-28 DIAGNOSIS — M9903 Segmental and somatic dysfunction of lumbar region: Secondary | ICD-10-CM | POA: Diagnosis not present

## 2015-12-30 DIAGNOSIS — M9902 Segmental and somatic dysfunction of thoracic region: Secondary | ICD-10-CM | POA: Diagnosis not present

## 2015-12-30 DIAGNOSIS — M47816 Spondylosis without myelopathy or radiculopathy, lumbar region: Secondary | ICD-10-CM | POA: Diagnosis not present

## 2015-12-30 DIAGNOSIS — M9903 Segmental and somatic dysfunction of lumbar region: Secondary | ICD-10-CM | POA: Diagnosis not present

## 2015-12-30 DIAGNOSIS — M546 Pain in thoracic spine: Secondary | ICD-10-CM | POA: Diagnosis not present

## 2015-12-30 DIAGNOSIS — M5442 Lumbago with sciatica, left side: Secondary | ICD-10-CM | POA: Diagnosis not present

## 2016-01-02 DIAGNOSIS — M546 Pain in thoracic spine: Secondary | ICD-10-CM | POA: Diagnosis not present

## 2016-01-02 DIAGNOSIS — M9903 Segmental and somatic dysfunction of lumbar region: Secondary | ICD-10-CM | POA: Diagnosis not present

## 2016-01-02 DIAGNOSIS — M5442 Lumbago with sciatica, left side: Secondary | ICD-10-CM | POA: Diagnosis not present

## 2016-01-02 DIAGNOSIS — M9902 Segmental and somatic dysfunction of thoracic region: Secondary | ICD-10-CM | POA: Diagnosis not present

## 2016-01-02 DIAGNOSIS — M47816 Spondylosis without myelopathy or radiculopathy, lumbar region: Secondary | ICD-10-CM | POA: Diagnosis not present

## 2016-04-18 DIAGNOSIS — E038 Other specified hypothyroidism: Secondary | ICD-10-CM | POA: Diagnosis not present

## 2016-04-18 DIAGNOSIS — N182 Chronic kidney disease, stage 2 (mild): Secondary | ICD-10-CM | POA: Diagnosis not present

## 2016-04-18 DIAGNOSIS — G300 Alzheimer's disease with early onset: Secondary | ICD-10-CM | POA: Diagnosis not present

## 2016-04-18 DIAGNOSIS — G4733 Obstructive sleep apnea (adult) (pediatric): Secondary | ICD-10-CM | POA: Diagnosis not present

## 2016-04-18 DIAGNOSIS — E782 Mixed hyperlipidemia: Secondary | ICD-10-CM | POA: Diagnosis not present

## 2016-04-18 DIAGNOSIS — E1122 Type 2 diabetes mellitus with diabetic chronic kidney disease: Secondary | ICD-10-CM | POA: Diagnosis not present

## 2016-04-18 DIAGNOSIS — E875 Hyperkalemia: Secondary | ICD-10-CM | POA: Diagnosis not present

## 2016-04-18 DIAGNOSIS — I10 Essential (primary) hypertension: Secondary | ICD-10-CM | POA: Diagnosis not present

## 2016-04-18 DIAGNOSIS — Z9189 Other specified personal risk factors, not elsewhere classified: Secondary | ICD-10-CM | POA: Diagnosis not present

## 2016-04-24 DIAGNOSIS — E782 Mixed hyperlipidemia: Secondary | ICD-10-CM | POA: Diagnosis not present

## 2016-04-24 DIAGNOSIS — I2510 Atherosclerotic heart disease of native coronary artery without angina pectoris: Secondary | ICD-10-CM | POA: Diagnosis not present

## 2016-04-24 DIAGNOSIS — G4733 Obstructive sleep apnea (adult) (pediatric): Secondary | ICD-10-CM | POA: Diagnosis not present

## 2016-04-24 DIAGNOSIS — Z6833 Body mass index (BMI) 33.0-33.9, adult: Secondary | ICD-10-CM | POA: Diagnosis not present

## 2016-04-24 DIAGNOSIS — E1122 Type 2 diabetes mellitus with diabetic chronic kidney disease: Secondary | ICD-10-CM | POA: Diagnosis not present

## 2016-04-24 DIAGNOSIS — N182 Chronic kidney disease, stage 2 (mild): Secondary | ICD-10-CM | POA: Diagnosis not present

## 2016-04-24 DIAGNOSIS — I10 Essential (primary) hypertension: Secondary | ICD-10-CM | POA: Diagnosis not present

## 2016-04-24 DIAGNOSIS — G300 Alzheimer's disease with early onset: Secondary | ICD-10-CM | POA: Diagnosis not present

## 2016-08-22 DIAGNOSIS — I2510 Atherosclerotic heart disease of native coronary artery without angina pectoris: Secondary | ICD-10-CM | POA: Diagnosis not present

## 2016-08-22 DIAGNOSIS — N182 Chronic kidney disease, stage 2 (mild): Secondary | ICD-10-CM | POA: Diagnosis not present

## 2016-08-22 DIAGNOSIS — Z9189 Other specified personal risk factors, not elsewhere classified: Secondary | ICD-10-CM | POA: Diagnosis not present

## 2016-08-22 DIAGNOSIS — Z6833 Body mass index (BMI) 33.0-33.9, adult: Secondary | ICD-10-CM | POA: Diagnosis not present

## 2016-08-22 DIAGNOSIS — G4733 Obstructive sleep apnea (adult) (pediatric): Secondary | ICD-10-CM | POA: Diagnosis not present

## 2016-08-22 DIAGNOSIS — G300 Alzheimer's disease with early onset: Secondary | ICD-10-CM | POA: Diagnosis not present

## 2016-08-22 DIAGNOSIS — I10 Essential (primary) hypertension: Secondary | ICD-10-CM | POA: Diagnosis not present

## 2016-08-22 DIAGNOSIS — E782 Mixed hyperlipidemia: Secondary | ICD-10-CM | POA: Diagnosis not present

## 2016-08-22 DIAGNOSIS — E1122 Type 2 diabetes mellitus with diabetic chronic kidney disease: Secondary | ICD-10-CM | POA: Diagnosis not present

## 2016-08-22 DIAGNOSIS — Z23 Encounter for immunization: Secondary | ICD-10-CM | POA: Diagnosis not present

## 2016-11-29 DIAGNOSIS — E782 Mixed hyperlipidemia: Secondary | ICD-10-CM | POA: Diagnosis not present

## 2016-11-29 DIAGNOSIS — G300 Alzheimer's disease with early onset: Secondary | ICD-10-CM | POA: Diagnosis not present

## 2016-11-29 DIAGNOSIS — I2510 Atherosclerotic heart disease of native coronary artery without angina pectoris: Secondary | ICD-10-CM | POA: Diagnosis not present

## 2016-11-29 DIAGNOSIS — E538 Deficiency of other specified B group vitamins: Secondary | ICD-10-CM | POA: Diagnosis not present

## 2016-11-29 DIAGNOSIS — Z9189 Other specified personal risk factors, not elsewhere classified: Secondary | ICD-10-CM | POA: Diagnosis not present

## 2016-11-29 DIAGNOSIS — E1122 Type 2 diabetes mellitus with diabetic chronic kidney disease: Secondary | ICD-10-CM | POA: Diagnosis not present

## 2016-11-29 DIAGNOSIS — E875 Hyperkalemia: Secondary | ICD-10-CM | POA: Diagnosis not present

## 2016-11-29 DIAGNOSIS — G4733 Obstructive sleep apnea (adult) (pediatric): Secondary | ICD-10-CM | POA: Diagnosis not present

## 2016-12-03 DIAGNOSIS — I2510 Atherosclerotic heart disease of native coronary artery without angina pectoris: Secondary | ICD-10-CM | POA: Diagnosis not present

## 2016-12-03 DIAGNOSIS — I10 Essential (primary) hypertension: Secondary | ICD-10-CM | POA: Diagnosis not present

## 2016-12-03 DIAGNOSIS — G300 Alzheimer's disease with early onset: Secondary | ICD-10-CM | POA: Diagnosis not present

## 2016-12-03 DIAGNOSIS — Z23 Encounter for immunization: Secondary | ICD-10-CM | POA: Diagnosis not present

## 2016-12-03 DIAGNOSIS — Z0001 Encounter for general adult medical examination with abnormal findings: Secondary | ICD-10-CM | POA: Diagnosis not present

## 2016-12-03 DIAGNOSIS — E1122 Type 2 diabetes mellitus with diabetic chronic kidney disease: Secondary | ICD-10-CM | POA: Diagnosis not present

## 2016-12-03 DIAGNOSIS — Z6834 Body mass index (BMI) 34.0-34.9, adult: Secondary | ICD-10-CM | POA: Diagnosis not present

## 2016-12-03 DIAGNOSIS — N182 Chronic kidney disease, stage 2 (mild): Secondary | ICD-10-CM | POA: Diagnosis not present

## 2016-12-04 DIAGNOSIS — L570 Actinic keratosis: Secondary | ICD-10-CM | POA: Diagnosis not present

## 2016-12-04 DIAGNOSIS — Z85828 Personal history of other malignant neoplasm of skin: Secondary | ICD-10-CM | POA: Diagnosis not present

## 2017-11-27 DIAGNOSIS — L570 Actinic keratosis: Secondary | ICD-10-CM | POA: Diagnosis not present

## 2017-11-27 DIAGNOSIS — L821 Other seborrheic keratosis: Secondary | ICD-10-CM | POA: Diagnosis not present

## 2017-11-27 DIAGNOSIS — Z85828 Personal history of other malignant neoplasm of skin: Secondary | ICD-10-CM | POA: Diagnosis not present

## 2017-12-03 DIAGNOSIS — Z6834 Body mass index (BMI) 34.0-34.9, adult: Secondary | ICD-10-CM | POA: Diagnosis not present

## 2017-12-03 DIAGNOSIS — Z0001 Encounter for general adult medical examination with abnormal findings: Secondary | ICD-10-CM | POA: Diagnosis not present

## 2017-12-03 DIAGNOSIS — I10 Essential (primary) hypertension: Secondary | ICD-10-CM | POA: Diagnosis not present

## 2017-12-03 DIAGNOSIS — Z23 Encounter for immunization: Secondary | ICD-10-CM | POA: Diagnosis not present

## 2017-12-03 DIAGNOSIS — E119 Type 2 diabetes mellitus without complications: Secondary | ICD-10-CM | POA: Diagnosis not present

## 2018-03-03 DIAGNOSIS — Z0001 Encounter for general adult medical examination with abnormal findings: Secondary | ICD-10-CM | POA: Diagnosis not present

## 2018-03-03 DIAGNOSIS — G4733 Obstructive sleep apnea (adult) (pediatric): Secondary | ICD-10-CM | POA: Diagnosis not present

## 2018-03-03 DIAGNOSIS — I10 Essential (primary) hypertension: Secondary | ICD-10-CM | POA: Diagnosis not present

## 2018-03-03 DIAGNOSIS — Z23 Encounter for immunization: Secondary | ICD-10-CM | POA: Diagnosis not present

## 2018-03-03 DIAGNOSIS — E782 Mixed hyperlipidemia: Secondary | ICD-10-CM | POA: Diagnosis not present

## 2018-03-03 DIAGNOSIS — E1122 Type 2 diabetes mellitus with diabetic chronic kidney disease: Secondary | ICD-10-CM | POA: Diagnosis not present

## 2018-03-03 DIAGNOSIS — Z9189 Other specified personal risk factors, not elsewhere classified: Secondary | ICD-10-CM | POA: Diagnosis not present

## 2018-03-03 DIAGNOSIS — N182 Chronic kidney disease, stage 2 (mild): Secondary | ICD-10-CM | POA: Diagnosis not present

## 2018-03-03 DIAGNOSIS — Z6834 Body mass index (BMI) 34.0-34.9, adult: Secondary | ICD-10-CM | POA: Diagnosis not present

## 2018-03-03 DIAGNOSIS — I2510 Atherosclerotic heart disease of native coronary artery without angina pectoris: Secondary | ICD-10-CM | POA: Diagnosis not present

## 2018-03-03 DIAGNOSIS — G300 Alzheimer's disease with early onset: Secondary | ICD-10-CM | POA: Diagnosis not present

## 2018-06-26 DIAGNOSIS — I10 Essential (primary) hypertension: Secondary | ICD-10-CM | POA: Diagnosis not present

## 2018-06-26 DIAGNOSIS — E1122 Type 2 diabetes mellitus with diabetic chronic kidney disease: Secondary | ICD-10-CM | POA: Diagnosis not present

## 2018-06-26 DIAGNOSIS — E782 Mixed hyperlipidemia: Secondary | ICD-10-CM | POA: Diagnosis not present

## 2018-06-26 DIAGNOSIS — G4733 Obstructive sleep apnea (adult) (pediatric): Secondary | ICD-10-CM | POA: Diagnosis not present

## 2018-06-26 DIAGNOSIS — I2510 Atherosclerotic heart disease of native coronary artery without angina pectoris: Secondary | ICD-10-CM | POA: Diagnosis not present

## 2018-07-01 DIAGNOSIS — Z6834 Body mass index (BMI) 34.0-34.9, adult: Secondary | ICD-10-CM | POA: Diagnosis not present

## 2018-07-01 DIAGNOSIS — I10 Essential (primary) hypertension: Secondary | ICD-10-CM | POA: Diagnosis not present

## 2018-07-01 DIAGNOSIS — E1122 Type 2 diabetes mellitus with diabetic chronic kidney disease: Secondary | ICD-10-CM | POA: Diagnosis not present

## 2018-07-01 DIAGNOSIS — G300 Alzheimer's disease with early onset: Secondary | ICD-10-CM | POA: Diagnosis not present

## 2018-07-01 DIAGNOSIS — I2510 Atherosclerotic heart disease of native coronary artery without angina pectoris: Secondary | ICD-10-CM | POA: Diagnosis not present

## 2018-07-01 DIAGNOSIS — G4733 Obstructive sleep apnea (adult) (pediatric): Secondary | ICD-10-CM | POA: Diagnosis not present

## 2018-07-01 DIAGNOSIS — Z0001 Encounter for general adult medical examination with abnormal findings: Secondary | ICD-10-CM | POA: Diagnosis not present

## 2018-07-01 DIAGNOSIS — E782 Mixed hyperlipidemia: Secondary | ICD-10-CM | POA: Diagnosis not present

## 2018-07-15 DIAGNOSIS — C44622 Squamous cell carcinoma of skin of right upper limb, including shoulder: Secondary | ICD-10-CM | POA: Diagnosis not present

## 2018-08-12 DIAGNOSIS — Z6835 Body mass index (BMI) 35.0-35.9, adult: Secondary | ICD-10-CM | POA: Diagnosis not present

## 2018-08-12 DIAGNOSIS — L03313 Cellulitis of chest wall: Secondary | ICD-10-CM | POA: Diagnosis not present

## 2018-12-01 DIAGNOSIS — L570 Actinic keratosis: Secondary | ICD-10-CM | POA: Diagnosis not present

## 2018-12-01 DIAGNOSIS — Z85820 Personal history of malignant melanoma of skin: Secondary | ICD-10-CM | POA: Diagnosis not present

## 2018-12-01 DIAGNOSIS — Z85828 Personal history of other malignant neoplasm of skin: Secondary | ICD-10-CM | POA: Diagnosis not present

## 2018-12-26 DIAGNOSIS — I10 Essential (primary) hypertension: Secondary | ICD-10-CM | POA: Diagnosis not present

## 2018-12-26 DIAGNOSIS — E782 Mixed hyperlipidemia: Secondary | ICD-10-CM | POA: Diagnosis not present

## 2019-01-01 DIAGNOSIS — E1122 Type 2 diabetes mellitus with diabetic chronic kidney disease: Secondary | ICD-10-CM | POA: Diagnosis not present

## 2019-01-01 DIAGNOSIS — E782 Mixed hyperlipidemia: Secondary | ICD-10-CM | POA: Diagnosis not present

## 2019-01-01 DIAGNOSIS — G4733 Obstructive sleep apnea (adult) (pediatric): Secondary | ICD-10-CM | POA: Diagnosis not present

## 2019-01-01 DIAGNOSIS — R5381 Other malaise: Secondary | ICD-10-CM | POA: Diagnosis not present

## 2019-01-01 DIAGNOSIS — I10 Essential (primary) hypertension: Secondary | ICD-10-CM | POA: Diagnosis not present

## 2019-01-01 DIAGNOSIS — G300 Alzheimer's disease with early onset: Secondary | ICD-10-CM | POA: Diagnosis not present

## 2019-01-01 DIAGNOSIS — E875 Hyperkalemia: Secondary | ICD-10-CM | POA: Diagnosis not present

## 2019-01-07 DIAGNOSIS — Z0001 Encounter for general adult medical examination with abnormal findings: Secondary | ICD-10-CM | POA: Diagnosis not present

## 2019-01-07 DIAGNOSIS — E1122 Type 2 diabetes mellitus with diabetic chronic kidney disease: Secondary | ICD-10-CM | POA: Diagnosis not present

## 2019-01-07 DIAGNOSIS — E782 Mixed hyperlipidemia: Secondary | ICD-10-CM | POA: Diagnosis not present

## 2019-01-07 DIAGNOSIS — I10 Essential (primary) hypertension: Secondary | ICD-10-CM | POA: Diagnosis not present

## 2019-01-07 DIAGNOSIS — I2510 Atherosclerotic heart disease of native coronary artery without angina pectoris: Secondary | ICD-10-CM | POA: Diagnosis not present

## 2019-01-07 DIAGNOSIS — Z6835 Body mass index (BMI) 35.0-35.9, adult: Secondary | ICD-10-CM | POA: Diagnosis not present

## 2019-01-07 DIAGNOSIS — G300 Alzheimer's disease with early onset: Secondary | ICD-10-CM | POA: Diagnosis not present

## 2019-01-07 DIAGNOSIS — G4733 Obstructive sleep apnea (adult) (pediatric): Secondary | ICD-10-CM | POA: Diagnosis not present

## 2019-01-21 ENCOUNTER — Other Ambulatory Visit: Payer: Self-pay

## 2019-05-27 DIAGNOSIS — I10 Essential (primary) hypertension: Secondary | ICD-10-CM | POA: Diagnosis not present

## 2019-05-27 DIAGNOSIS — E7849 Other hyperlipidemia: Secondary | ICD-10-CM | POA: Diagnosis not present

## 2019-06-26 DIAGNOSIS — E7849 Other hyperlipidemia: Secondary | ICD-10-CM | POA: Diagnosis not present

## 2019-06-26 DIAGNOSIS — I10 Essential (primary) hypertension: Secondary | ICD-10-CM | POA: Diagnosis not present

## 2019-07-02 DIAGNOSIS — E1122 Type 2 diabetes mellitus with diabetic chronic kidney disease: Secondary | ICD-10-CM | POA: Diagnosis not present

## 2019-07-02 DIAGNOSIS — G300 Alzheimer's disease with early onset: Secondary | ICD-10-CM | POA: Diagnosis not present

## 2019-07-02 DIAGNOSIS — R5381 Other malaise: Secondary | ICD-10-CM | POA: Diagnosis not present

## 2019-07-02 DIAGNOSIS — E782 Mixed hyperlipidemia: Secondary | ICD-10-CM | POA: Diagnosis not present

## 2019-07-02 DIAGNOSIS — I10 Essential (primary) hypertension: Secondary | ICD-10-CM | POA: Diagnosis not present

## 2019-07-02 DIAGNOSIS — N182 Chronic kidney disease, stage 2 (mild): Secondary | ICD-10-CM | POA: Diagnosis not present

## 2019-07-07 DIAGNOSIS — I10 Essential (primary) hypertension: Secondary | ICD-10-CM | POA: Diagnosis not present

## 2019-07-07 DIAGNOSIS — I2510 Atherosclerotic heart disease of native coronary artery without angina pectoris: Secondary | ICD-10-CM | POA: Diagnosis not present

## 2019-07-07 DIAGNOSIS — Z6835 Body mass index (BMI) 35.0-35.9, adult: Secondary | ICD-10-CM | POA: Diagnosis not present

## 2019-07-07 DIAGNOSIS — E782 Mixed hyperlipidemia: Secondary | ICD-10-CM | POA: Diagnosis not present

## 2019-07-07 DIAGNOSIS — E1122 Type 2 diabetes mellitus with diabetic chronic kidney disease: Secondary | ICD-10-CM | POA: Diagnosis not present

## 2019-07-07 DIAGNOSIS — G4733 Obstructive sleep apnea (adult) (pediatric): Secondary | ICD-10-CM | POA: Diagnosis not present

## 2019-07-07 DIAGNOSIS — G300 Alzheimer's disease with early onset: Secondary | ICD-10-CM | POA: Diagnosis not present

## 2019-07-27 DIAGNOSIS — I2510 Atherosclerotic heart disease of native coronary artery without angina pectoris: Secondary | ICD-10-CM | POA: Diagnosis not present

## 2019-07-27 DIAGNOSIS — N182 Chronic kidney disease, stage 2 (mild): Secondary | ICD-10-CM | POA: Diagnosis not present

## 2019-07-27 DIAGNOSIS — E7849 Other hyperlipidemia: Secondary | ICD-10-CM | POA: Diagnosis not present

## 2019-07-27 DIAGNOSIS — E1122 Type 2 diabetes mellitus with diabetic chronic kidney disease: Secondary | ICD-10-CM | POA: Diagnosis not present

## 2019-07-27 DIAGNOSIS — I129 Hypertensive chronic kidney disease with stage 1 through stage 4 chronic kidney disease, or unspecified chronic kidney disease: Secondary | ICD-10-CM | POA: Diagnosis not present

## 2019-08-26 DIAGNOSIS — E1122 Type 2 diabetes mellitus with diabetic chronic kidney disease: Secondary | ICD-10-CM | POA: Diagnosis not present

## 2019-08-26 DIAGNOSIS — I129 Hypertensive chronic kidney disease with stage 1 through stage 4 chronic kidney disease, or unspecified chronic kidney disease: Secondary | ICD-10-CM | POA: Diagnosis not present

## 2019-08-26 DIAGNOSIS — E7849 Other hyperlipidemia: Secondary | ICD-10-CM | POA: Diagnosis not present

## 2019-08-26 DIAGNOSIS — I2510 Atherosclerotic heart disease of native coronary artery without angina pectoris: Secondary | ICD-10-CM | POA: Diagnosis not present

## 2019-08-26 DIAGNOSIS — N182 Chronic kidney disease, stage 2 (mild): Secondary | ICD-10-CM | POA: Diagnosis not present

## 2019-12-01 DIAGNOSIS — Z85820 Personal history of malignant melanoma of skin: Secondary | ICD-10-CM | POA: Diagnosis not present

## 2019-12-01 DIAGNOSIS — L570 Actinic keratosis: Secondary | ICD-10-CM | POA: Diagnosis not present

## 2019-12-01 DIAGNOSIS — L814 Other melanin hyperpigmentation: Secondary | ICD-10-CM | POA: Diagnosis not present

## 2019-12-01 DIAGNOSIS — Z85828 Personal history of other malignant neoplasm of skin: Secondary | ICD-10-CM | POA: Diagnosis not present

## 2019-12-17 DIAGNOSIS — D485 Neoplasm of uncertain behavior of skin: Secondary | ICD-10-CM | POA: Diagnosis not present

## 2019-12-17 DIAGNOSIS — L720 Epidermal cyst: Secondary | ICD-10-CM | POA: Diagnosis not present

## 2020-01-13 DIAGNOSIS — G300 Alzheimer's disease with early onset: Secondary | ICD-10-CM | POA: Diagnosis not present

## 2020-01-13 DIAGNOSIS — N182 Chronic kidney disease, stage 2 (mild): Secondary | ICD-10-CM | POA: Diagnosis not present

## 2020-01-13 DIAGNOSIS — E782 Mixed hyperlipidemia: Secondary | ICD-10-CM | POA: Diagnosis not present

## 2020-01-13 DIAGNOSIS — R5381 Other malaise: Secondary | ICD-10-CM | POA: Diagnosis not present

## 2020-01-13 DIAGNOSIS — E875 Hyperkalemia: Secondary | ICD-10-CM | POA: Diagnosis not present

## 2020-01-13 DIAGNOSIS — E1122 Type 2 diabetes mellitus with diabetic chronic kidney disease: Secondary | ICD-10-CM | POA: Diagnosis not present

## 2020-01-13 DIAGNOSIS — I10 Essential (primary) hypertension: Secondary | ICD-10-CM | POA: Diagnosis not present

## 2020-01-19 DIAGNOSIS — G300 Alzheimer's disease with early onset: Secondary | ICD-10-CM | POA: Diagnosis not present

## 2020-01-19 DIAGNOSIS — E782 Mixed hyperlipidemia: Secondary | ICD-10-CM | POA: Diagnosis not present

## 2020-01-19 DIAGNOSIS — I25119 Atherosclerotic heart disease of native coronary artery with unspecified angina pectoris: Secondary | ICD-10-CM | POA: Diagnosis not present

## 2020-01-19 DIAGNOSIS — Z0001 Encounter for general adult medical examination with abnormal findings: Secondary | ICD-10-CM | POA: Diagnosis not present

## 2020-01-19 DIAGNOSIS — Z6833 Body mass index (BMI) 33.0-33.9, adult: Secondary | ICD-10-CM | POA: Diagnosis not present

## 2020-01-19 DIAGNOSIS — I10 Essential (primary) hypertension: Secondary | ICD-10-CM | POA: Diagnosis not present

## 2020-01-19 DIAGNOSIS — Z23 Encounter for immunization: Secondary | ICD-10-CM | POA: Diagnosis not present

## 2020-01-19 DIAGNOSIS — E1122 Type 2 diabetes mellitus with diabetic chronic kidney disease: Secondary | ICD-10-CM | POA: Diagnosis not present

## 2020-02-26 DIAGNOSIS — I129 Hypertensive chronic kidney disease with stage 1 through stage 4 chronic kidney disease, or unspecified chronic kidney disease: Secondary | ICD-10-CM | POA: Diagnosis not present

## 2020-02-26 DIAGNOSIS — E7849 Other hyperlipidemia: Secondary | ICD-10-CM | POA: Diagnosis not present

## 2020-02-26 DIAGNOSIS — N182 Chronic kidney disease, stage 2 (mild): Secondary | ICD-10-CM | POA: Diagnosis not present

## 2020-02-26 DIAGNOSIS — E1122 Type 2 diabetes mellitus with diabetic chronic kidney disease: Secondary | ICD-10-CM | POA: Diagnosis not present

## 2020-04-25 DIAGNOSIS — I129 Hypertensive chronic kidney disease with stage 1 through stage 4 chronic kidney disease, or unspecified chronic kidney disease: Secondary | ICD-10-CM | POA: Diagnosis not present

## 2020-04-25 DIAGNOSIS — N182 Chronic kidney disease, stage 2 (mild): Secondary | ICD-10-CM | POA: Diagnosis not present

## 2020-04-25 DIAGNOSIS — E1122 Type 2 diabetes mellitus with diabetic chronic kidney disease: Secondary | ICD-10-CM | POA: Diagnosis not present

## 2020-04-25 DIAGNOSIS — I2510 Atherosclerotic heart disease of native coronary artery without angina pectoris: Secondary | ICD-10-CM | POA: Diagnosis not present

## 2020-04-25 DIAGNOSIS — E7849 Other hyperlipidemia: Secondary | ICD-10-CM | POA: Diagnosis not present

## 2020-07-07 DIAGNOSIS — I10 Essential (primary) hypertension: Secondary | ICD-10-CM | POA: Diagnosis not present

## 2020-07-07 DIAGNOSIS — E7849 Other hyperlipidemia: Secondary | ICD-10-CM | POA: Diagnosis not present

## 2020-07-07 DIAGNOSIS — E1122 Type 2 diabetes mellitus with diabetic chronic kidney disease: Secondary | ICD-10-CM | POA: Diagnosis not present

## 2020-07-07 DIAGNOSIS — E782 Mixed hyperlipidemia: Secondary | ICD-10-CM | POA: Diagnosis not present

## 2020-07-07 DIAGNOSIS — E875 Hyperkalemia: Secondary | ICD-10-CM | POA: Diagnosis not present

## 2020-07-07 DIAGNOSIS — E119 Type 2 diabetes mellitus without complications: Secondary | ICD-10-CM | POA: Diagnosis not present

## 2020-07-07 DIAGNOSIS — R946 Abnormal results of thyroid function studies: Secondary | ICD-10-CM | POA: Diagnosis not present

## 2020-07-12 DIAGNOSIS — E7849 Other hyperlipidemia: Secondary | ICD-10-CM | POA: Diagnosis not present

## 2020-07-12 DIAGNOSIS — G300 Alzheimer's disease with early onset: Secondary | ICD-10-CM | POA: Diagnosis not present

## 2020-07-12 DIAGNOSIS — I25111 Atherosclerotic heart disease of native coronary artery with angina pectoris with documented spasm: Secondary | ICD-10-CM | POA: Diagnosis not present

## 2020-07-12 DIAGNOSIS — Z7189 Other specified counseling: Secondary | ICD-10-CM | POA: Diagnosis not present

## 2020-07-12 DIAGNOSIS — G4733 Obstructive sleep apnea (adult) (pediatric): Secondary | ICD-10-CM | POA: Diagnosis not present

## 2020-07-12 DIAGNOSIS — Z6833 Body mass index (BMI) 33.0-33.9, adult: Secondary | ICD-10-CM | POA: Diagnosis not present

## 2020-07-12 DIAGNOSIS — E1122 Type 2 diabetes mellitus with diabetic chronic kidney disease: Secondary | ICD-10-CM | POA: Diagnosis not present

## 2020-07-12 DIAGNOSIS — I10 Essential (primary) hypertension: Secondary | ICD-10-CM | POA: Diagnosis not present

## 2020-08-25 DIAGNOSIS — I129 Hypertensive chronic kidney disease with stage 1 through stage 4 chronic kidney disease, or unspecified chronic kidney disease: Secondary | ICD-10-CM | POA: Diagnosis not present

## 2020-08-25 DIAGNOSIS — E1122 Type 2 diabetes mellitus with diabetic chronic kidney disease: Secondary | ICD-10-CM | POA: Diagnosis not present

## 2020-08-25 DIAGNOSIS — I2510 Atherosclerotic heart disease of native coronary artery without angina pectoris: Secondary | ICD-10-CM | POA: Diagnosis not present

## 2020-08-25 DIAGNOSIS — E7849 Other hyperlipidemia: Secondary | ICD-10-CM | POA: Diagnosis not present

## 2020-08-25 DIAGNOSIS — N182 Chronic kidney disease, stage 2 (mild): Secondary | ICD-10-CM | POA: Diagnosis not present

## 2020-10-26 DIAGNOSIS — N182 Chronic kidney disease, stage 2 (mild): Secondary | ICD-10-CM | POA: Diagnosis not present

## 2020-10-26 DIAGNOSIS — I129 Hypertensive chronic kidney disease with stage 1 through stage 4 chronic kidney disease, or unspecified chronic kidney disease: Secondary | ICD-10-CM | POA: Diagnosis not present

## 2020-10-26 DIAGNOSIS — I2510 Atherosclerotic heart disease of native coronary artery without angina pectoris: Secondary | ICD-10-CM | POA: Diagnosis not present

## 2020-10-26 DIAGNOSIS — E7849 Other hyperlipidemia: Secondary | ICD-10-CM | POA: Diagnosis not present

## 2020-10-26 DIAGNOSIS — E1122 Type 2 diabetes mellitus with diabetic chronic kidney disease: Secondary | ICD-10-CM | POA: Diagnosis not present

## 2020-11-25 DIAGNOSIS — E7849 Other hyperlipidemia: Secondary | ICD-10-CM | POA: Diagnosis not present

## 2020-11-25 DIAGNOSIS — E1122 Type 2 diabetes mellitus with diabetic chronic kidney disease: Secondary | ICD-10-CM | POA: Diagnosis not present

## 2020-11-25 DIAGNOSIS — N182 Chronic kidney disease, stage 2 (mild): Secondary | ICD-10-CM | POA: Diagnosis not present

## 2020-11-25 DIAGNOSIS — I129 Hypertensive chronic kidney disease with stage 1 through stage 4 chronic kidney disease, or unspecified chronic kidney disease: Secondary | ICD-10-CM | POA: Diagnosis not present

## 2020-11-25 DIAGNOSIS — I2510 Atherosclerotic heart disease of native coronary artery without angina pectoris: Secondary | ICD-10-CM | POA: Diagnosis not present

## 2020-11-30 DIAGNOSIS — Z85820 Personal history of malignant melanoma of skin: Secondary | ICD-10-CM | POA: Diagnosis not present

## 2020-11-30 DIAGNOSIS — Z85828 Personal history of other malignant neoplasm of skin: Secondary | ICD-10-CM | POA: Diagnosis not present

## 2020-11-30 DIAGNOSIS — L570 Actinic keratosis: Secondary | ICD-10-CM | POA: Diagnosis not present

## 2020-12-20 DIAGNOSIS — H401121 Primary open-angle glaucoma, left eye, mild stage: Secondary | ICD-10-CM | POA: Diagnosis not present

## 2020-12-20 DIAGNOSIS — E119 Type 2 diabetes mellitus without complications: Secondary | ICD-10-CM | POA: Diagnosis not present

## 2020-12-20 DIAGNOSIS — Z961 Presence of intraocular lens: Secondary | ICD-10-CM | POA: Diagnosis not present

## 2020-12-20 DIAGNOSIS — Z7984 Long term (current) use of oral hypoglycemic drugs: Secondary | ICD-10-CM | POA: Diagnosis not present

## 2020-12-20 DIAGNOSIS — H401112 Primary open-angle glaucoma, right eye, moderate stage: Secondary | ICD-10-CM | POA: Diagnosis not present

## 2020-12-20 DIAGNOSIS — H524 Presbyopia: Secondary | ICD-10-CM | POA: Diagnosis not present

## 2021-01-09 DIAGNOSIS — E7849 Other hyperlipidemia: Secondary | ICD-10-CM | POA: Diagnosis not present

## 2021-01-09 DIAGNOSIS — E782 Mixed hyperlipidemia: Secondary | ICD-10-CM | POA: Diagnosis not present

## 2021-01-09 DIAGNOSIS — Z1329 Encounter for screening for other suspected endocrine disorder: Secondary | ICD-10-CM | POA: Diagnosis not present

## 2021-01-09 DIAGNOSIS — E538 Deficiency of other specified B group vitamins: Secondary | ICD-10-CM | POA: Diagnosis not present

## 2021-01-09 DIAGNOSIS — N182 Chronic kidney disease, stage 2 (mild): Secondary | ICD-10-CM | POA: Diagnosis not present

## 2021-01-09 DIAGNOSIS — E875 Hyperkalemia: Secondary | ICD-10-CM | POA: Diagnosis not present

## 2021-01-09 DIAGNOSIS — R5381 Other malaise: Secondary | ICD-10-CM | POA: Diagnosis not present

## 2021-01-09 DIAGNOSIS — E1122 Type 2 diabetes mellitus with diabetic chronic kidney disease: Secondary | ICD-10-CM | POA: Diagnosis not present

## 2021-01-12 DIAGNOSIS — G300 Alzheimer's disease with early onset: Secondary | ICD-10-CM | POA: Diagnosis not present

## 2021-01-12 DIAGNOSIS — I25119 Atherosclerotic heart disease of native coronary artery with unspecified angina pectoris: Secondary | ICD-10-CM | POA: Diagnosis not present

## 2021-01-12 DIAGNOSIS — E1122 Type 2 diabetes mellitus with diabetic chronic kidney disease: Secondary | ICD-10-CM | POA: Diagnosis not present

## 2021-01-12 DIAGNOSIS — I10 Essential (primary) hypertension: Secondary | ICD-10-CM | POA: Diagnosis not present

## 2021-01-12 DIAGNOSIS — G4733 Obstructive sleep apnea (adult) (pediatric): Secondary | ICD-10-CM | POA: Diagnosis not present

## 2021-01-12 DIAGNOSIS — E7849 Other hyperlipidemia: Secondary | ICD-10-CM | POA: Diagnosis not present

## 2021-01-12 DIAGNOSIS — Z23 Encounter for immunization: Secondary | ICD-10-CM | POA: Diagnosis not present

## 2021-01-12 DIAGNOSIS — Z0001 Encounter for general adult medical examination with abnormal findings: Secondary | ICD-10-CM | POA: Diagnosis not present

## 2021-05-29 DIAGNOSIS — Z6831 Body mass index (BMI) 31.0-31.9, adult: Secondary | ICD-10-CM | POA: Diagnosis not present

## 2021-05-29 DIAGNOSIS — G300 Alzheimer's disease with early onset: Secondary | ICD-10-CM | POA: Diagnosis not present

## 2021-05-29 DIAGNOSIS — R300 Dysuria: Secondary | ICD-10-CM | POA: Diagnosis not present

## 2021-05-29 DIAGNOSIS — R5383 Other fatigue: Secondary | ICD-10-CM | POA: Diagnosis not present

## 2021-07-20 DIAGNOSIS — G4733 Obstructive sleep apnea (adult) (pediatric): Secondary | ICD-10-CM | POA: Diagnosis not present

## 2021-07-20 DIAGNOSIS — E782 Mixed hyperlipidemia: Secondary | ICD-10-CM | POA: Diagnosis not present

## 2021-07-20 DIAGNOSIS — N1831 Chronic kidney disease, stage 3a: Secondary | ICD-10-CM | POA: Diagnosis not present

## 2021-07-20 DIAGNOSIS — E7849 Other hyperlipidemia: Secondary | ICD-10-CM | POA: Diagnosis not present

## 2021-07-20 DIAGNOSIS — I10 Essential (primary) hypertension: Secondary | ICD-10-CM | POA: Diagnosis not present

## 2021-07-20 DIAGNOSIS — E1122 Type 2 diabetes mellitus with diabetic chronic kidney disease: Secondary | ICD-10-CM | POA: Diagnosis not present

## 2021-07-26 DIAGNOSIS — G300 Alzheimer's disease with early onset: Secondary | ICD-10-CM | POA: Diagnosis not present

## 2021-07-26 DIAGNOSIS — E1122 Type 2 diabetes mellitus with diabetic chronic kidney disease: Secondary | ICD-10-CM | POA: Diagnosis not present

## 2021-07-26 DIAGNOSIS — G4733 Obstructive sleep apnea (adult) (pediatric): Secondary | ICD-10-CM | POA: Diagnosis not present

## 2021-07-26 DIAGNOSIS — I10 Essential (primary) hypertension: Secondary | ICD-10-CM | POA: Diagnosis not present

## 2021-07-26 DIAGNOSIS — E7849 Other hyperlipidemia: Secondary | ICD-10-CM | POA: Diagnosis not present

## 2021-07-26 DIAGNOSIS — Z23 Encounter for immunization: Secondary | ICD-10-CM | POA: Diagnosis not present

## 2021-07-26 DIAGNOSIS — I25119 Atherosclerotic heart disease of native coronary artery with unspecified angina pectoris: Secondary | ICD-10-CM | POA: Diagnosis not present

## 2021-07-26 DIAGNOSIS — Z0001 Encounter for general adult medical examination with abnormal findings: Secondary | ICD-10-CM | POA: Diagnosis not present

## 2021-09-26 DIAGNOSIS — Z23 Encounter for immunization: Secondary | ICD-10-CM | POA: Diagnosis not present

## 2021-11-28 DIAGNOSIS — L570 Actinic keratosis: Secondary | ICD-10-CM | POA: Diagnosis not present

## 2021-11-28 DIAGNOSIS — D485 Neoplasm of uncertain behavior of skin: Secondary | ICD-10-CM | POA: Diagnosis not present

## 2021-11-28 DIAGNOSIS — L820 Inflamed seborrheic keratosis: Secondary | ICD-10-CM | POA: Diagnosis not present

## 2022-01-16 DIAGNOSIS — R5381 Other malaise: Secondary | ICD-10-CM | POA: Diagnosis not present

## 2022-01-16 DIAGNOSIS — G4733 Obstructive sleep apnea (adult) (pediatric): Secondary | ICD-10-CM | POA: Diagnosis not present

## 2022-01-16 DIAGNOSIS — E7849 Other hyperlipidemia: Secondary | ICD-10-CM | POA: Diagnosis not present

## 2022-01-16 DIAGNOSIS — G300 Alzheimer's disease with early onset: Secondary | ICD-10-CM | POA: Diagnosis not present

## 2022-01-16 DIAGNOSIS — E875 Hyperkalemia: Secondary | ICD-10-CM | POA: Diagnosis not present

## 2022-01-16 DIAGNOSIS — Z1329 Encounter for screening for other suspected endocrine disorder: Secondary | ICD-10-CM | POA: Diagnosis not present

## 2022-01-16 DIAGNOSIS — E1122 Type 2 diabetes mellitus with diabetic chronic kidney disease: Secondary | ICD-10-CM | POA: Diagnosis not present

## 2022-01-16 DIAGNOSIS — N1831 Chronic kidney disease, stage 3a: Secondary | ICD-10-CM | POA: Diagnosis not present

## 2022-01-24 DIAGNOSIS — N1832 Chronic kidney disease, stage 3b: Secondary | ICD-10-CM | POA: Diagnosis not present

## 2022-01-24 DIAGNOSIS — Z23 Encounter for immunization: Secondary | ICD-10-CM | POA: Diagnosis not present

## 2022-01-24 DIAGNOSIS — I25119 Atherosclerotic heart disease of native coronary artery with unspecified angina pectoris: Secondary | ICD-10-CM | POA: Diagnosis not present

## 2022-01-24 DIAGNOSIS — E1122 Type 2 diabetes mellitus with diabetic chronic kidney disease: Secondary | ICD-10-CM | POA: Diagnosis not present

## 2022-01-24 DIAGNOSIS — G300 Alzheimer's disease with early onset: Secondary | ICD-10-CM | POA: Diagnosis not present

## 2022-01-24 DIAGNOSIS — G4733 Obstructive sleep apnea (adult) (pediatric): Secondary | ICD-10-CM | POA: Diagnosis not present

## 2022-01-24 DIAGNOSIS — I10 Essential (primary) hypertension: Secondary | ICD-10-CM | POA: Diagnosis not present

## 2022-01-24 DIAGNOSIS — E7849 Other hyperlipidemia: Secondary | ICD-10-CM | POA: Diagnosis not present

## 2022-01-24 DIAGNOSIS — Z6829 Body mass index (BMI) 29.0-29.9, adult: Secondary | ICD-10-CM | POA: Diagnosis not present

## 2022-02-02 DIAGNOSIS — I10 Essential (primary) hypertension: Secondary | ICD-10-CM | POA: Diagnosis not present

## 2022-06-20 DIAGNOSIS — E7849 Other hyperlipidemia: Secondary | ICD-10-CM | POA: Diagnosis not present

## 2022-06-20 DIAGNOSIS — E119 Type 2 diabetes mellitus without complications: Secondary | ICD-10-CM | POA: Diagnosis not present

## 2022-06-20 DIAGNOSIS — I10 Essential (primary) hypertension: Secondary | ICD-10-CM | POA: Diagnosis not present

## 2022-06-20 DIAGNOSIS — R5381 Other malaise: Secondary | ICD-10-CM | POA: Diagnosis not present

## 2022-06-20 DIAGNOSIS — N1832 Chronic kidney disease, stage 3b: Secondary | ICD-10-CM | POA: Diagnosis not present

## 2022-06-20 DIAGNOSIS — R5383 Other fatigue: Secondary | ICD-10-CM | POA: Diagnosis not present

## 2022-06-20 DIAGNOSIS — E875 Hyperkalemia: Secondary | ICD-10-CM | POA: Diagnosis not present

## 2022-06-25 DIAGNOSIS — Z23 Encounter for immunization: Secondary | ICD-10-CM | POA: Diagnosis not present

## 2022-06-25 DIAGNOSIS — Z0001 Encounter for general adult medical examination with abnormal findings: Secondary | ICD-10-CM | POA: Diagnosis not present

## 2022-06-25 DIAGNOSIS — E782 Mixed hyperlipidemia: Secondary | ICD-10-CM | POA: Diagnosis not present

## 2022-06-25 DIAGNOSIS — E7849 Other hyperlipidemia: Secondary | ICD-10-CM | POA: Diagnosis not present

## 2022-06-25 DIAGNOSIS — G4733 Obstructive sleep apnea (adult) (pediatric): Secondary | ICD-10-CM | POA: Diagnosis not present

## 2022-06-25 DIAGNOSIS — I10 Essential (primary) hypertension: Secondary | ICD-10-CM | POA: Diagnosis not present

## 2022-06-25 DIAGNOSIS — G300 Alzheimer's disease with early onset: Secondary | ICD-10-CM | POA: Diagnosis not present

## 2022-06-25 DIAGNOSIS — Z6830 Body mass index (BMI) 30.0-30.9, adult: Secondary | ICD-10-CM | POA: Diagnosis not present

## 2022-06-25 DIAGNOSIS — E1122 Type 2 diabetes mellitus with diabetic chronic kidney disease: Secondary | ICD-10-CM | POA: Diagnosis not present

## 2022-06-25 DIAGNOSIS — I25119 Atherosclerotic heart disease of native coronary artery with unspecified angina pectoris: Secondary | ICD-10-CM | POA: Diagnosis not present

## 2022-06-25 DIAGNOSIS — N1832 Chronic kidney disease, stage 3b: Secondary | ICD-10-CM | POA: Diagnosis not present

## 2022-06-25 DIAGNOSIS — Z9189 Other specified personal risk factors, not elsewhere classified: Secondary | ICD-10-CM | POA: Diagnosis not present

## 2022-10-17 DIAGNOSIS — I10 Essential (primary) hypertension: Secondary | ICD-10-CM | POA: Diagnosis not present

## 2022-10-17 DIAGNOSIS — E7849 Other hyperlipidemia: Secondary | ICD-10-CM | POA: Diagnosis not present

## 2022-10-17 DIAGNOSIS — N1832 Chronic kidney disease, stage 3b: Secondary | ICD-10-CM | POA: Diagnosis not present

## 2022-10-17 DIAGNOSIS — E119 Type 2 diabetes mellitus without complications: Secondary | ICD-10-CM | POA: Diagnosis not present

## 2022-10-17 DIAGNOSIS — Z1329 Encounter for screening for other suspected endocrine disorder: Secondary | ICD-10-CM | POA: Diagnosis not present

## 2022-10-17 DIAGNOSIS — E1122 Type 2 diabetes mellitus with diabetic chronic kidney disease: Secondary | ICD-10-CM | POA: Diagnosis not present

## 2022-10-17 DIAGNOSIS — E875 Hyperkalemia: Secondary | ICD-10-CM | POA: Diagnosis not present

## 2022-10-24 DIAGNOSIS — Z23 Encounter for immunization: Secondary | ICD-10-CM | POA: Diagnosis not present

## 2022-10-24 DIAGNOSIS — G300 Alzheimer's disease with early onset: Secondary | ICD-10-CM | POA: Diagnosis not present

## 2022-10-24 DIAGNOSIS — Z0001 Encounter for general adult medical examination with abnormal findings: Secondary | ICD-10-CM | POA: Diagnosis not present

## 2022-10-24 DIAGNOSIS — I10 Essential (primary) hypertension: Secondary | ICD-10-CM | POA: Diagnosis not present

## 2022-10-24 DIAGNOSIS — Z9189 Other specified personal risk factors, not elsewhere classified: Secondary | ICD-10-CM | POA: Diagnosis not present

## 2022-10-24 DIAGNOSIS — G4733 Obstructive sleep apnea (adult) (pediatric): Secondary | ICD-10-CM | POA: Diagnosis not present

## 2022-10-24 DIAGNOSIS — E1122 Type 2 diabetes mellitus with diabetic chronic kidney disease: Secondary | ICD-10-CM | POA: Diagnosis not present

## 2022-10-24 DIAGNOSIS — E7849 Other hyperlipidemia: Secondary | ICD-10-CM | POA: Diagnosis not present

## 2022-10-24 DIAGNOSIS — N1832 Chronic kidney disease, stage 3b: Secondary | ICD-10-CM | POA: Diagnosis not present

## 2022-10-24 DIAGNOSIS — Z6830 Body mass index (BMI) 30.0-30.9, adult: Secondary | ICD-10-CM | POA: Diagnosis not present

## 2022-10-24 DIAGNOSIS — I25119 Atherosclerotic heart disease of native coronary artery with unspecified angina pectoris: Secondary | ICD-10-CM | POA: Diagnosis not present

## 2022-12-31 ENCOUNTER — Emergency Department (HOSPITAL_COMMUNITY): Payer: PPO

## 2022-12-31 ENCOUNTER — Other Ambulatory Visit: Payer: Self-pay

## 2022-12-31 ENCOUNTER — Encounter (HOSPITAL_COMMUNITY): Payer: Self-pay

## 2022-12-31 ENCOUNTER — Inpatient Hospital Stay (HOSPITAL_COMMUNITY)
Admission: EM | Admit: 2022-12-31 | Discharge: 2023-01-04 | DRG: 280 | Disposition: A | Discharge status: Home Health | Payer: PPO | Attending: Cardiology | Admitting: Cardiology

## 2022-12-31 DIAGNOSIS — I2510 Atherosclerotic heart disease of native coronary artery without angina pectoris: Secondary | ICD-10-CM | POA: Diagnosis not present

## 2022-12-31 DIAGNOSIS — Z87891 Personal history of nicotine dependence: Secondary | ICD-10-CM

## 2022-12-31 DIAGNOSIS — Z5982 Transportation insecurity: Secondary | ICD-10-CM

## 2022-12-31 DIAGNOSIS — Z955 Presence of coronary angioplasty implant and graft: Secondary | ICD-10-CM

## 2022-12-31 DIAGNOSIS — F02B0 Dementia in other diseases classified elsewhere, moderate, without behavioral disturbance, psychotic disturbance, mood disturbance, and anxiety: Secondary | ICD-10-CM | POA: Diagnosis not present

## 2022-12-31 DIAGNOSIS — T8481XA Embolism due to internal orthopedic prosthetic devices, implants and grafts, initial encounter: Secondary | ICD-10-CM | POA: Diagnosis not present

## 2022-12-31 DIAGNOSIS — I252 Old myocardial infarction: Secondary | ICD-10-CM

## 2022-12-31 DIAGNOSIS — E119 Type 2 diabetes mellitus without complications: Secondary | ICD-10-CM | POA: Diagnosis present

## 2022-12-31 DIAGNOSIS — I4510 Unspecified right bundle-branch block: Secondary | ICD-10-CM | POA: Diagnosis present

## 2022-12-31 DIAGNOSIS — I110 Hypertensive heart disease with heart failure: Secondary | ICD-10-CM | POA: Diagnosis not present

## 2022-12-31 DIAGNOSIS — Z1883 Retained stone or crystalline fragments: Secondary | ICD-10-CM | POA: Diagnosis not present

## 2022-12-31 DIAGNOSIS — G309 Alzheimer's disease, unspecified: Secondary | ICD-10-CM | POA: Diagnosis present

## 2022-12-31 DIAGNOSIS — Z87442 Personal history of urinary calculi: Secondary | ICD-10-CM

## 2022-12-31 DIAGNOSIS — Z85820 Personal history of malignant melanoma of skin: Secondary | ICD-10-CM | POA: Diagnosis not present

## 2022-12-31 DIAGNOSIS — I2603 Cement embolism of pulmonary artery with acute cor pulmonale: Secondary | ICD-10-CM | POA: Diagnosis not present

## 2022-12-31 DIAGNOSIS — Z7401 Bed confinement status: Secondary | ICD-10-CM | POA: Diagnosis not present

## 2022-12-31 DIAGNOSIS — Z86711 Personal history of pulmonary embolism: Secondary | ICD-10-CM | POA: Diagnosis not present

## 2022-12-31 DIAGNOSIS — Z8249 Family history of ischemic heart disease and other diseases of the circulatory system: Secondary | ICD-10-CM | POA: Diagnosis not present

## 2022-12-31 DIAGNOSIS — Z880 Allergy status to penicillin: Secondary | ICD-10-CM

## 2022-12-31 DIAGNOSIS — Z7902 Long term (current) use of antithrombotics/antiplatelets: Secondary | ICD-10-CM

## 2022-12-31 DIAGNOSIS — G300 Alzheimer's disease with early onset: Secondary | ICD-10-CM

## 2022-12-31 DIAGNOSIS — I2609 Other pulmonary embolism with acute cor pulmonale: Secondary | ICD-10-CM

## 2022-12-31 DIAGNOSIS — Z7982 Long term (current) use of aspirin: Secondary | ICD-10-CM

## 2022-12-31 DIAGNOSIS — Z85828 Personal history of other malignant neoplasm of skin: Secondary | ICD-10-CM

## 2022-12-31 DIAGNOSIS — I214 Non-ST elevation (NSTEMI) myocardial infarction: Principal | ICD-10-CM | POA: Diagnosis present

## 2022-12-31 DIAGNOSIS — I82452 Acute embolism and thrombosis of left peroneal vein: Secondary | ICD-10-CM | POA: Diagnosis present

## 2022-12-31 DIAGNOSIS — E7800 Pure hypercholesterolemia, unspecified: Secondary | ICD-10-CM | POA: Diagnosis present

## 2022-12-31 DIAGNOSIS — Z66 Do not resuscitate: Secondary | ICD-10-CM | POA: Diagnosis not present

## 2022-12-31 DIAGNOSIS — R4182 Altered mental status, unspecified: Secondary | ICD-10-CM | POA: Diagnosis not present

## 2022-12-31 DIAGNOSIS — Z79899 Other long term (current) drug therapy: Secondary | ICD-10-CM

## 2022-12-31 DIAGNOSIS — I10 Essential (primary) hypertension: Secondary | ICD-10-CM | POA: Diagnosis present

## 2022-12-31 DIAGNOSIS — R0600 Dyspnea, unspecified: Secondary | ICD-10-CM | POA: Diagnosis not present

## 2022-12-31 DIAGNOSIS — I700 Atherosclerosis of aorta: Secondary | ICD-10-CM | POA: Diagnosis not present

## 2022-12-31 DIAGNOSIS — I2699 Other pulmonary embolism without acute cor pulmonale: Secondary | ICD-10-CM | POA: Diagnosis not present

## 2022-12-31 DIAGNOSIS — E782 Mixed hyperlipidemia: Secondary | ICD-10-CM | POA: Diagnosis present

## 2022-12-31 DIAGNOSIS — N200 Calculus of kidney: Secondary | ICD-10-CM | POA: Diagnosis not present

## 2022-12-31 LAB — BASIC METABOLIC PANEL
Anion gap: 12 (ref 5–15)
BUN: 23 mg/dL (ref 8–23)
CO2: 22 mmol/L (ref 22–32)
Calcium: 8.8 mg/dL — ABNORMAL LOW (ref 8.9–10.3)
Chloride: 105 mmol/L (ref 98–111)
Creatinine, Ser: 1.68 mg/dL — ABNORMAL HIGH (ref 0.61–1.24)
GFR, Estimated: 41 mL/min — ABNORMAL LOW (ref >60)
Glucose, Bld: 157 mg/dL — ABNORMAL HIGH (ref 70–99)
Potassium: 4.1 mmol/L (ref 3.5–5.1)
Sodium: 139 mmol/L (ref 135–145)

## 2022-12-31 LAB — CBC
HCT: 43.2 % (ref 39.0–52.0)
Hemoglobin: 14 g/dL (ref 13.0–17.0)
MCH: 28.6 pg (ref 26.0–34.0)
MCHC: 32.4 g/dL (ref 30.0–36.0)
MCV: 88.3 fL (ref 80.0–100.0)
Platelets: 282 10*3/uL (ref 150–400)
RBC: 4.89 MIL/uL (ref 4.22–5.81)
RDW: 13.6 % (ref 11.5–15.5)
WBC: 14.4 10*3/uL — ABNORMAL HIGH (ref 4.0–10.5)
nRBC: 0 % (ref 0.0–0.2)

## 2022-12-31 LAB — APTT: aPTT: 24 s (ref 24–36)

## 2022-12-31 LAB — PROTIME-INR
INR: 1.1 (ref 0.8–1.2)
Prothrombin Time: 14.6 s (ref 11.4–15.2)

## 2022-12-31 LAB — HEPATIC FUNCTION PANEL
ALT: 13 U/L (ref 0–44)
AST: 14 U/L — ABNORMAL LOW (ref 15–41)
Albumin: 3.6 g/dL (ref 3.5–5.0)
Alkaline Phosphatase: 68 U/L (ref 38–126)
Bilirubin, Direct: <0.1 mg/dL (ref 0.0–0.2)
Total Bilirubin: 0.5 mg/dL (ref <1.2)
Total Protein: 7.3 g/dL (ref 6.5–8.1)

## 2022-12-31 LAB — TROPONIN I (HIGH SENSITIVITY)
Troponin I (High Sensitivity): 300 ng/L (ref <18)
Troponin I (High Sensitivity): 401 ng/L (ref <18)

## 2022-12-31 MED ORDER — HEPARIN (PORCINE) 25000 UT/250ML-% IV SOLN
1400.0000 [IU]/h | INTRAVENOUS | Status: DC
  Administered 2022-12-31: 950 [IU]/h via INTRAVENOUS
  Administered 2023-01-01 – 2023-01-02 (×2): 1250 [IU]/h via INTRAVENOUS
  Filled 2022-12-31 (×3): qty 250

## 2022-12-31 MED ORDER — HEPARIN BOLUS VIA INFUSION
4000.0000 [IU] | Freq: Once | INTRAVENOUS | Status: AC
  Administered 2022-12-31: 4000 [IU] via INTRAVENOUS

## 2022-12-31 NOTE — ED Provider Notes (Signed)
There are no cardiac telemetry beds or regular telemetry beds or cardiac progressive beds available for this patient with an NSTEMI to go to.  I spoke with Dr. Kurtis Bushman and he agrees the patient should be transferred to [facility] emergency department until there is a bed available.  Dr. Rennis Golden is the admitting cardiologist,   Dr. Bebe Shaggy is the accepting ED physician   Bethann Berkshire, MD 12/31/22 2306

## 2022-12-31 NOTE — Discharge Instructions (Signed)
Patient will be transferred to [facility] emergency department with Dr. Bebe Shaggy excepting.  Cardiologist Dr. Rennis Golden is admitting and Dr. Kurtis Bushman agreed with the ED transfer

## 2022-12-31 NOTE — ED Notes (Signed)
Report given to [place], [facility] charge

## 2022-12-31 NOTE — Consult Note (Signed)
Cardiology Consultation   Patient ID: Paul Strickland MRN: [id number]; DOB: 1946/01/01  Admit date: 12/31/2022 Date of Consult: 12/31/2022  PCP:  Richardean Chimera, MD   [facility] Providers Cardiologist:  None        Patient Profile:   Paul Strickland is a 77 y.o. male with a hx of CAD s/p DES to OM in 2014, HTN, HL, dementia who is being seen 12/31/2022 for the evaluation of NSTEMI at the request of [name].  History of Present Illness:   Paul Strickland presented to [facility] ED this evening. He had intermittent chest pain yesterday followed by persistent pain lasting an hour with shortness of breath today. Located left side of chest, non radiating, self resolved. He had DES x 1 to OM placed in 09/2012 in the setting of an NSTEMI. Last TTE 01/2012 showed mildly reduced EF, otherwise unremarkable.  In the ED, he was hypertensive to 150/100s. Labs notable for Cr 1.7 from normal baseline, WBC 14.4, troponin 300 -> 401, EKG in NSR with RBBB, without ischemic changes. He was given ASA load, continued on home plavix, and started on heparin. He is now chest pain free. ROS otherwise negative.   Past Medical History:  Diagnosis Date   Acute renal insufficiency    a. Cr 1.48 in 01/2012, with resolution.   Bradycardia    a. HR 60's, fatigue in 05/2012 - beta blocker discontinued.   CAD (coronary artery disease)    a. NSTEMI 12/13 - LHC AV groove CFX 99% too small for PCI, o/w nonobst dz => med Rx. b. NSTEMI 09/2012: s/p PTCA/DES to PLOM of RCA, known AV groove Cx stenosis, o/w nonobstructive dz for med rx. EF 55-60%.   Carotid stenosis    a. dopplers 12/13: RICA < 40%   Dementia (HCC)    a. Early dementia, on Exelon. Mother had early onset dementia.   Diabetes mellitus type 2, diet-controlled (HCC)    HTN (hypertension)    Hx of echocardiogram    a. Echo 02/04/12: EF 50-55%, no wall motion abnormalities, grade 1 diastolic dysfunction, MAC, mild LAE, mild RVE with moderately  reduced RV systolic function, mild RAE   Kidney stones    Leukocytosis    a. 09/2012 - for outpt repeat to ensure improvement.   Melanoma (HCC)    a. Exercised from head in 2012, did not spread.   Mixed dyslipidemia    Low HDL, elevated TRIG   Skin cancer    a. Excised from back in 2012.   Sleep apnea    "resolved after losing alot of weight" (10/14/2012)    Past Surgical History:  Procedure Laterality Date   BACK SURGERY     CARDIAC CATHETERIZATION  01/2012   CORONARY ANGIOPLASTY WITH STENT PLACEMENT  10/14/2012   "1" (10/14/2012)   LEFT HEART CATHETERIZATION WITH CORONARY ANGIOGRAM N/A 02/04/2012   Procedure: LEFT HEART CATHETERIZATION WITH CORONARY ANGIOGRAM;  Surgeon: Kathleene Hazel, MD;  Location: [facility] CATH LAB;  Service: Cardiovascular;  Laterality: N/A;   LEFT HEART CATHETERIZATION WITH CORONARY ANGIOGRAM N/A 10/14/2012   Procedure: LEFT HEART CATHETERIZATION WITH CORONARY ANGIOGRAM;  Surgeon: Peter M Swaziland, MD;  Location: [facility] CATH LAB;  Service: Cardiovascular;  Laterality: N/A;   LITHOTRIPSY  1990's   "5 times" (10/14/2012)   LUMBAR DISC SURGERY  ?1997   "fixed ruptured disc" (10/14/2012)   MELANOMA EXCISION  ?2012   "top of head" (10/14/2012)   PERCUTANEOUS CORONARY STENT INTERVENTION (PCI-S)  Right 10/14/2012   Procedure: PERCUTANEOUS CORONARY STENT INTERVENTION (PCI-S);  Surgeon: Peter M Swaziland, MD;  Location: [facility] CATH LAB;  Service: Cardiovascular;  Laterality: Right;   SKIN CANCER EXCISION  2012   "back" (10/14/2012)       Inpatient Medications: Scheduled Meds:  Continuous Infusions:  heparin 950 Units/hr (12/31/22 2201)   PRN Meds:   Allergies:    Allergies  Allergen Reactions   Penicillins Rash    Social History:   Social History   Socioeconomic History   Marital status: Married    Spouse name: Not on file   Number of children: Not on file   Years of education: Not on file   Highest education level: Not on file  Occupational History   Not on  file  Tobacco Use   Smoking status: Former    Current packs/day: 0.00    Average packs/day: 0.1 packs/day for 4.0 years (0.5 ttl pk-yrs)    Types: Cigarettes    Start date: 02/03/1963    Quit date: 02/03/1967    Years since quitting: 55.9   Smokeless tobacco: Never   Tobacco comments:    As a teen occasionally  Substance and Sexual Activity   Alcohol use: Yes    Comment: 10/14/2012 "drank a little in the [employer]; none since"   Drug use: No   Sexual activity: Yes  Other Topics Concern   Not on file  Social History Narrative   Not on file   Social Determinants of Health   Financial Resource Strain: Not on file  Food Insecurity: Not on file  Transportation Needs: Not on file  Physical Activity: Not on file  Stress: Not on file  Social Connections: Not on file  Intimate Partner Violence: Not on file    Family History:    Family History  Problem Relation Age of Onset   Dementia Mother        Early onset   CAD Father        MI at 92     ROS:  Please see the history of present illness.   All other ROS reviewed and negative.     Physical Exam/Data:   Vitals:   12/31/22 1955 12/31/22 2000 12/31/22 2002 12/31/22 2139  BP:  (!) 154/104    Pulse: 95 77 74   Resp: 18 (!) 21 19   Temp: 97.8 F (36.6 C)     TempSrc: Oral     SpO2: 95% 91% 95%   Weight:    81.6 kg  Height:    5\' 7"  (1.702 m)   No intake or output data in the 24 hours ending 12/31/22 2346    12/31/2022    9:39 PM 02/03/2013    3:53 PM 12/11/2012   10:17 AM  Last 3 Weights  Weight (lbs) 180 lb 216 lb 213 lb 9.6 oz  Weight (kg) 81.647 kg 97.977 kg 96.888 kg     Body mass index is 28.19 kg/m.  General:  Well nourished, well developed, in no acute distress HEENT: normal Neck: no JVD Vascular: No carotid bruits; Distal pulses 2+ bilaterally Cardiac:  normal S1, S2; RRR; no murmur  Lungs:  clear to auscultation bilaterally, no wheezing, rhonchi or rales  Abd: soft, nontender, no hepatomegaly  Ext:  no edema Musculoskeletal:  No deformities, BUE and BLE strength normal and equal Skin: warm and dry  Neuro:  CNs 2-12 intact, no focal abnormalities noted Psych:  Normal affect   EKG:  The EKG  was personally reviewed and demonstrates:   Telemetry:  Telemetry was personally reviewed and demonstrates:  NSR  Relevant CV Studies: TTE 01/2012: - Left ventricle: The cavity size was normal. Wall thickness    was normal. Systolic function was normal. The estimated    ejection fraction was 50%, in the range of 50% to 55%.    Wall motion was normal; there were no regional wall motion    abnormalities. Doppler parameters are consistent with    abnormal left ventricular relaxation (grade 1 diastolic    dysfunction).  - Mitral valve: Calcified annulus.  - Left atrium: The atrium was mildly dilated.  - Right ventricle: The cavity size was mildly dilated.    Systolic function was mildly reduced.  - Right atrium: The atrium was mildly dilated.   Do not have imaging of most recent [facility] 09/2012.  Laboratory Data:  High Sensitivity Troponin:   Recent Labs  Lab 12/31/22 2007 12/31/22 2155  TROPONINIHS 300* 401*     Chemistry Recent Labs  Lab 12/31/22 2007  NA 139  K 4.1  CL 105  CO2 22  GLUCOSE 157*  BUN 23  CREATININE 1.68*  CALCIUM 8.8*  GFRNONAA 41*  ANIONGAP 12    Recent Labs  Lab 12/31/22 2007  PROT 7.3  ALBUMIN 3.6  AST 14*  ALT 13  ALKPHOS 68  BILITOT 0.5   Lipids No results for input(s): "CHOL", "TRIG", "HDL", "LABVLDL", "LDLCALC", "CHOLHDL" in the last 168 hours.  Hematology Recent Labs  Lab 12/31/22 2007  WBC 14.4*  RBC 4.89  HGB 14.0  HCT 43.2  MCV 88.3  MCH 28.6  MCHC 32.4  RDW 13.6  PLT 282   Thyroid No results for input(s): "TSH", "FREET4" in the last 168 hours.  BNPNo results for input(s): "BNP", "PROBNP" in the last 168 hours.  DDimer No results for input(s): "DDIMER" in the last 168 hours.   Radiology/Studies:  DG Chest Port 1 View  Result  Date: 12/31/2022 CLINICAL DATA:  Dyspnea, hypertension EXAM: PORTABLE CHEST 1 VIEW COMPARISON:  10/14/2012 FINDINGS: Lungs are well expanded, symmetric, and clear. No pneumothorax or pleural effusion. Cardiac size within normal limits. Pulmonary vascularity is normal. Osseous structures are age-appropriate. No acute bone abnormality. IMPRESSION: No active disease. Electronically Signed   By: Helyn Numbers M.D.   On: 12/31/2022 20:44     Assessment and Plan:   NSTEMI, likely Type I HTN, HL - plan to transfer to [facility] for LHC - NPO for LHC - confirm ASA load on arrival - continue home plavix - continue home losartan, atorvastatin - start heparin gtt - order TTE  Risk Assessment/Risk Scores:     TIMI Risk Score for Unstable Angina or Non-ST Elevation MI:   The patient's TIMI risk score is 4, which indicates a 20% risk of all cause mortality, new or recurrent myocardial infarction or need for urgent revascularization in the next 14 days.       For questions or updates, please contact [facility] Please consult [email or web address] for contact info under    Signed, Lynwood Dawley, MD  12/31/2022 11:46 PM

## 2022-12-31 NOTE — ED Triage Notes (Signed)
Pt c/o cjest pain episode that started last night and got but got worse tonight. Endorses SOB. Pt hx of 2 heart attacks.

## 2022-12-31 NOTE — Progress Notes (Signed)
PHARMACY - ANTICOAGULATION CONSULT NOTE  Pharmacy Consult for Heparin Infusion Indication: chest pain/ACS  Allergies  Allergen Reactions   Penicillins Rash    Patient Measurements:   Heparin Dosing Weight: 81.6 kg   Vital Signs: Temp: 97.8 F (36.6 C) (11/04 1955) Temp Source: Oral (11/04 1955) BP: 154/104 (11/04 2000) Pulse Rate: 74 (11/04 2002)  Labs: Recent Labs    12/31/22 2007  HGB 14.0  HCT 43.2  PLT 282  CREATININE 1.68*  TROPONINIHS 300*    CrCl cannot be calculated (Unknown ideal weight.).   Medical History: Past Medical History:  Diagnosis Date   Acute renal insufficiency    a. Cr 1.48 in 01/2012, with resolution.   Bradycardia    a. HR 60's, fatigue in 05/2012 - beta blocker discontinued.   CAD (coronary artery disease)    a. NSTEMI 12/13 - LHC AV groove CFX 99% too small for PCI, o/w nonobst dz => med Rx. b. NSTEMI 09/2012: s/p PTCA/DES to PLOM of RCA, known AV groove Cx stenosis, o/w nonobstructive dz for med rx. EF 55-60%.   Carotid stenosis    a. dopplers 12/13: RICA < 40%   Dementia (HCC)    a. Early dementia, on Exelon. Mother had early onset dementia.   Diabetes mellitus type 2, diet-controlled (HCC)    HTN (hypertension)    Hx of echocardiogram    a. Echo 02/04/12: EF 50-55%, no wall motion abnormalities, grade 1 diastolic dysfunction, MAC, mild LAE, mild RVE with moderately reduced RV systolic function, mild RAE   Kidney stones    Leukocytosis    a. 09/2012 - for outpt repeat to ensure improvement.   Melanoma (HCC)    a. Exercised from head in 2012, did not spread.   Mixed dyslipidemia    Low HDL, elevated TRIG   Skin cancer    a. Excised from back in 2012.   Sleep apnea    "resolved after losing alot of weight" (10/14/2012)    Assessment: Patient is a 77 year old male with a past medical history of CAD and dementia who presents with chest pain and shortness of breath. Pharmacy was consulted to initiate patient on a heparin infusion.  According to this patient's dispense history, he was not previously on anticoagulation.   Baseline INR and aPTT ordered  No signs/symptoms of bleeding noted in the chart. Hgb 14.0. PLT 282.  Goal of Therapy:  Heparin level 0.3-0.7 units/ml Monitor platelets by anticoagulation protocol: Yes   Plan:  Give 4000 units bolus x 1 Start heparin infusion at 950 units/hr Check anti-Xa level in 8 hours and daily while on heparin Continue to monitor H&H and platelets  Merryl Hacker, PharmD Clinical Pharmacist 12/31/2022,9:34 PM

## 2022-12-31 NOTE — ED Provider Notes (Signed)
[place] EMERGENCY DEPARTMENT AT [facility] Provider Note   CSN: [id number] Arrival date & time: 12/31/22  1946     History {Add pertinent medical, surgical, social history, OB history to HPI:1} No chief complaint on file.   Paul Strickland is a 77 y.o. male.  Patient has a history of coronary artery disease and dementia.  He had a stent placed about 10 years ago and has had 2 MIs.  Patient had some chest pain yesterday off-and-on and then had an hour worth of chest pain today with shortness of breath.  He is presently not hurting  The history is provided by the patient and a relative. No language interpreter was used.  Chest Pain Pain location:  L chest Pain quality: aching   Pain radiates to:  Does not radiate Pain severity:  Moderate Onset quality:  Sudden Timing:  Constant Progression:  Worsening Chronicity:  New Context: not breathing   Relieved by:  Nothing Associated symptoms: no abdominal pain, no back pain, no cough, no fatigue and no headache        Home Medications Prior to Admission medications   Medication Sig Start Date End Date Taking? Authorizing Provider  aspirin (ASPIRIN EC) 81 MG EC tablet Take 81 mg by mouth daily. Swallow whole.    [provider]  atorvastatin (LIPITOR) 80 MG tablet Take 1 tablet (80 mg total) by mouth daily. 02/05/12   Barrett, Joline Salt, PA-C  clopidogrel (PLAVIX) 75 MG tablet Take 1 tablet (75 mg total) by mouth daily. 02/05/12   Barrett, Joline Salt, PA-C  isosorbide mononitrate (IMDUR) 30 MG 24 hr tablet TAKE 1 TABLET BY MOUTH EVERY DAY 03/16/14   Kathleene Hazel, MD  losartan (COZAAR) 25 MG tablet Take 1 tablet (25 mg total) by mouth daily. 10/15/12   Dunn, Tacey Ruiz, PA-C  nitroGLYCERIN (NITROSTAT) 0.4 MG SL tablet Place 1 tablet (0.4 mg total) under the tongue every 5 (five) minutes as needed for chest pain (up to 3 doses). 10/15/12   Dunn, Tacey Ruiz, PA-C  Rivastigmine 13.3 MG/24HR PT24 Place 1 patch onto  the skin daily. exelon patch for memory    [provider]  ZOSTAVAX 82956 UNT/0.65ML injection  10/21/12   [provider]      Allergies    Penicillins    Review of Systems   Review of Systems  Constitutional:  Negative for appetite change and fatigue.  HENT:  Negative for congestion, ear discharge and sinus pressure.   Eyes:  Negative for discharge.  Respiratory:  Negative for cough.   Cardiovascular:  Positive for chest pain.  Gastrointestinal:  Negative for abdominal pain and diarrhea.  Genitourinary:  Negative for frequency and hematuria.  Musculoskeletal:  Negative for back pain.  Skin:  Negative for rash.  Neurological:  Negative for seizures and headaches.  Psychiatric/Behavioral:  Negative for hallucinations.     Physical Exam Updated Vital Signs BP (!) 154/104   Pulse 74   Temp 97.8 F (36.6 C) (Oral)   Resp 19   SpO2 95%  Physical Exam Vitals and nursing note reviewed.  Constitutional:      Appearance: He is well-developed.  HENT:     Head: Normocephalic.     Nose: Nose normal.  Eyes:     General: No scleral icterus.    Conjunctiva/sclera: Conjunctivae normal.  Neck:     Thyroid: No thyromegaly.  Cardiovascular:     Rate and Rhythm: Normal rate and regular rhythm.  Heart sounds: No murmur heard.    No friction rub. No gallop.  Pulmonary:     Breath sounds: No stridor. No wheezing or rales.  Chest:     Chest wall: No tenderness.  Abdominal:     General: There is no distension.     Tenderness: There is no abdominal tenderness. There is no rebound.  Musculoskeletal:        General: Normal range of motion.     Cervical back: Neck supple.  Lymphadenopathy:     Cervical: No cervical adenopathy.  Skin:    Findings: No erythema or rash.  Neurological:     Mental Status: He is alert and oriented to person, place, and time.     Motor: No abnormal muscle tone.     Coordination: Coordination normal.  Psychiatric:        Behavior:  Behavior normal.     ED Results / Procedures / Treatments   Labs (all labs ordered are listed, but only abnormal results are displayed) Labs Reviewed  BASIC METABOLIC PANEL - Abnormal; Notable for the following components:      Result Value   Glucose, Bld 157 (*)    Creatinine, Ser 1.68 (*)    Calcium 8.8 (*)    GFR, Estimated 41 (*)    All other components within normal limits  CBC - Abnormal; Notable for the following components:   WBC 14.4 (*)    All other components within normal limits  HEPATIC FUNCTION PANEL - Abnormal; Notable for the following components:   AST 14 (*)    All other components within normal limits  TROPONIN I (HIGH SENSITIVITY) - Abnormal; Notable for the following components:   Troponin I (High Sensitivity) 300 (*)    All other components within normal limits    EKG None  Radiology DG Chest Port 1 View  Result Date: 12/31/2022 CLINICAL DATA:  Dyspnea, hypertension EXAM: PORTABLE CHEST 1 VIEW COMPARISON:  10/14/2012 FINDINGS: Lungs are well expanded, symmetric, and clear. No pneumothorax or pleural effusion. Cardiac size within normal limits. Pulmonary vascularity is normal. Osseous structures are age-appropriate. No acute bone abnormality. IMPRESSION: No active disease. Electronically Signed   By: Helyn Numbers M.D.   On: 12/31/2022 20:44    Procedures Procedures  {Document cardiac monitor, telemetry assessment procedure when appropriate:1}  Medications Ordered in ED Medications - No data to display  ED Course/ Medical Decision Making/ A&P   {  CRITICAL CARE Performed by: Bethann Berkshire Total critical care time: 45 minutes Critical care time was exclusive of separately billable procedures and treating other patients. Critical care was necessary to treat or prevent imminent or life-threatening deterioration. Critical care was time spent personally by me on the following activities: development of treatment plan with patient and/or surrogate as  well as nursing, discussions with consultants, evaluation of patient's response to treatment, examination of patient, obtaining history from patient or surrogate, ordering and performing treatments and interventions, ordering and review of laboratory studies, ordering and review of radiographic studies, pulse oximetry and re-evaluation of patient's condition.  Click here for ABCD2, HEART and other calculatorsREFRESH Note before signing :1}                              Medical Decision Making Amount and/or Complexity of Data Reviewed Labs: ordered. Radiology: ordered.  Risk Decision regarding hospitalization.   Patient with an NSTEMI.  I spoke with cardiology and we will admit  the patient to [facility] telemetry unit.  Patient will be started on heparin  {Document critical care time when appropriate:1} {Document review of labs and clinical decision tools ie heart score, Chads2Vasc2 etc:1}  {Document your independent review of radiology images, and any outside records:1} {Document your discussion with family members, caretakers, and with consultants:1} {Document social determinants of health affecting pt's care:1} {Document your decision making why or why not admission, treatments were needed:1} Final Clinical Impression(s) / ED Diagnoses Final diagnoses:  None    Rx / DC Orders ED Discharge Orders     None

## 2023-01-01 ENCOUNTER — Encounter (HOSPITAL_COMMUNITY)
Admission: EM | Disposition: A | Discharge status: Home Health | Payer: Self-pay | Source: Home / Self Care | Attending: Cardiology

## 2023-01-01 ENCOUNTER — Inpatient Hospital Stay (HOSPITAL_COMMUNITY): Payer: PPO

## 2023-01-01 DIAGNOSIS — G309 Alzheimer's disease, unspecified: Secondary | ICD-10-CM | POA: Diagnosis not present

## 2023-01-01 DIAGNOSIS — I214 Non-ST elevation (NSTEMI) myocardial infarction: Secondary | ICD-10-CM | POA: Diagnosis not present

## 2023-01-01 DIAGNOSIS — F02B0 Dementia in other diseases classified elsewhere, moderate, without behavioral disturbance, psychotic disturbance, mood disturbance, and anxiety: Secondary | ICD-10-CM

## 2023-01-01 DIAGNOSIS — I10 Essential (primary) hypertension: Secondary | ICD-10-CM | POA: Diagnosis not present

## 2023-01-01 LAB — BASIC METABOLIC PANEL
Anion gap: 10 (ref 5–15)
BUN: 18 mg/dL (ref 8–23)
CO2: 21 mmol/L — ABNORMAL LOW (ref 22–32)
Calcium: 8.4 mg/dL — ABNORMAL LOW (ref 8.9–10.3)
Chloride: 108 mmol/L (ref 98–111)
Creatinine, Ser: 1.5 mg/dL — ABNORMAL HIGH (ref 0.61–1.24)
GFR, Estimated: 47 mL/min — ABNORMAL LOW (ref >60)
Glucose, Bld: 120 mg/dL — ABNORMAL HIGH (ref 70–99)
Potassium: 4 mmol/L (ref 3.5–5.1)
Sodium: 139 mmol/L (ref 135–145)

## 2023-01-01 LAB — CBC
HCT: 41.9 % (ref 39.0–52.0)
Hemoglobin: 13.4 g/dL (ref 13.0–17.0)
MCH: 28.6 pg (ref 26.0–34.0)
MCHC: 32 g/dL (ref 30.0–36.0)
MCV: 89.5 fL (ref 80.0–100.0)
Platelets: 253 10*3/uL (ref 150–400)
RBC: 4.68 MIL/uL (ref 4.22–5.81)
RDW: 13.4 % (ref 11.5–15.5)
WBC: 13.2 10*3/uL — ABNORMAL HIGH (ref 4.0–10.5)
nRBC: 0 % (ref 0.0–0.2)

## 2023-01-01 LAB — PROTIME-INR
INR: 1.1 (ref 0.8–1.2)
Prothrombin Time: 14.7 s (ref 11.4–15.2)

## 2023-01-01 LAB — ECHOCARDIOGRAM COMPLETE
Height: 67 in
S' Lateral: 3.4 cm
Weight: 2880 [oz_av]

## 2023-01-01 LAB — LIPID PANEL
Cholesterol: 168 mg/dL (ref 0–200)
HDL: 31 mg/dL — ABNORMAL LOW (ref >40)
LDL Cholesterol: 107 mg/dL — ABNORMAL HIGH (ref 0–99)
Total CHOL/HDL Ratio: 5.4 {ratio}
Triglycerides: 148 mg/dL (ref <150)
VLDL: 30 mg/dL (ref 0–40)

## 2023-01-01 LAB — HEPARIN LEVEL (UNFRACTIONATED)
Heparin Unfractionated: <0.1 [IU]/mL — ABNORMAL LOW (ref 0.30–0.70)
Heparin Unfractionated: 0.16 [IU]/mL — ABNORMAL LOW (ref 0.30–0.70)

## 2023-01-01 SURGERY — LEFT HEART CATH AND CORONARY ANGIOGRAPHY
Anesthesia: LOCAL

## 2023-01-01 MED ORDER — ASPIRIN 81 MG PO CHEW
324.0000 mg | CHEWABLE_TABLET | ORAL | Status: AC
  Administered 2023-01-01: 324 mg via ORAL
  Filled 2023-01-01: qty 4

## 2023-01-01 MED ORDER — HEPARIN BOLUS VIA INFUSION
2000.0000 [IU] | Freq: Once | INTRAVENOUS | Status: AC
  Administered 2023-01-01: 2000 [IU] via INTRAVENOUS
  Filled 2023-01-01: qty 2000

## 2023-01-01 MED ORDER — ONDANSETRON HCL 4 MG/2ML IJ SOLN: 4.0000 mg | Freq: Four times a day (QID) | INTRAMUSCULAR | Status: DC | PRN

## 2023-01-01 MED ORDER — AMLODIPINE BESYLATE 5 MG PO TABS
5.0000 mg | ORAL_TABLET | Freq: Every day | ORAL | Status: DC
  Administered 2023-01-01 – 2023-01-04 (×4): 5 mg via ORAL
  Filled 2023-01-01 (×4): qty 1

## 2023-01-01 MED ORDER — HEPARIN BOLUS VIA INFUSION
2000.0000 [IU] | Freq: Once | INTRAVENOUS | Status: AC
Start: 2023-01-01 — End: 2023-01-01
  Administered 2023-01-01: 2000 [IU] via INTRAVENOUS
  Filled 2023-01-01: qty 2000

## 2023-01-01 MED ORDER — ATORVASTATIN CALCIUM 80 MG PO TABS
80.0000 mg | ORAL_TABLET | Freq: Every day | ORAL | Status: DC
  Administered 2023-01-01 – 2023-01-04 (×4): 80 mg via ORAL
  Filled 2023-01-01 (×4): qty 1

## 2023-01-01 MED ORDER — ASPIRIN 300 MG RE SUPP: 300.0000 mg | RECTAL | Status: AC

## 2023-01-01 MED ORDER — NITROGLYCERIN 0.4 MG SL SUBL: 0.4000 mg | SUBLINGUAL_TABLET | SUBLINGUAL | Status: DC | PRN

## 2023-01-01 MED ORDER — ASPIRIN 81 MG PO TBEC
81.0000 mg | DELAYED_RELEASE_TABLET | Freq: Every day | ORAL | Status: DC
  Administered 2023-01-02 – 2023-01-04 (×3): 81 mg via ORAL
  Filled 2023-01-01 (×3): qty 1

## 2023-01-01 MED ORDER — ACETAMINOPHEN 325 MG PO TABS
650.0000 mg | ORAL_TABLET | ORAL | Status: DC | PRN
Start: 2023-01-01 — End: 2023-01-04
  Administered 2023-01-01 – 2023-01-03 (×2): 650 mg via ORAL
  Filled 2023-01-01 (×2): qty 2

## 2023-01-01 NOTE — Plan of Care (Signed)

## 2023-01-01 NOTE — ED Triage Notes (Signed)
Pt transfer from [facility] for NSTEMI. Pt currently on heparin infusion with no complaints. Pt A/o x 2

## 2023-01-01 NOTE — ED Notes (Signed)
Pt back from Echo

## 2023-01-01 NOTE — Progress Notes (Signed)
Echocardiogram 2D Echocardiogram has been performed.  Warren Lacy Rosabell Geyer RDCS 01/01/2023, 8:51 AM

## 2023-01-01 NOTE — ED Notes (Signed)
Attempted lab draw unsuccessful contacted phlebotomy for draw.

## 2023-01-01 NOTE — Progress Notes (Signed)
PHARMACY - ANTICOAGULATION CONSULT NOTE  Pharmacy Consult for Heparin Infusion Indication: chest pain/ACS  Allergies  Allergen Reactions   Penicillins Rash    Patient Measurements: Height: 5\' 7"  (170.2 cm) Weight: 81.6 kg (180 lb) IBW/kg (Calculated) : 66.1 Heparin Dosing Weight: 81.6 kg   Vital Signs: Temp: 98 F (36.7 C) (11/05 0500) Temp Source: Oral (11/05 0400) BP: 138/86 (11/05 0500) Pulse Rate: 62 (11/05 0500)  Labs: Recent Labs    12/31/22 2007 12/31/22 2155 01/01/23 0447  HGB 14.0  --  13.4  HCT 43.2  --  41.9  PLT 282  --  253  APTT 24  --   --   LABPROT 14.6  --  14.7  INR 1.1  --  1.1  HEPARINUNFRC  --   --  <0.10*  CREATININE 1.68*  --   --   TROPONINIHS 300* 401*  --     Estimated Creatinine Clearance: 37.1 mL/min (A) (by C-G formula based on SCr of 1.68 mg/dL (H)).   Medical History: Past Medical History:  Diagnosis Date   Acute renal insufficiency    a. Cr 1.48 in 01/2012, with resolution.   Bradycardia    a. HR 60's, fatigue in 05/2012 - beta blocker discontinued.   CAD (coronary artery disease)    a. NSTEMI 12/13 - LHC AV groove CFX 99% too small for PCI, o/w nonobst dz => med Rx. b. NSTEMI 09/2012: s/p PTCA/DES to PLOM of RCA, known AV groove Cx stenosis, o/w nonobstructive dz for med rx. EF 55-60%.   Carotid stenosis    a. dopplers 12/13: RICA < 40%   Dementia (HCC)    a. Early dementia, on Exelon. Mother had early onset dementia.   Diabetes mellitus type 2, diet-controlled (HCC)    HTN (hypertension)    Hx of echocardiogram    a. Echo 02/04/12: EF 50-55%, no wall motion abnormalities, grade 1 diastolic dysfunction, MAC, mild LAE, mild RVE with moderately reduced RV systolic function, mild RAE   Kidney stones    Leukocytosis    a. 09/2012 - for outpt repeat to ensure improvement.   Melanoma (HCC)    a. Exercised from head in 2012, did not spread.   Mixed dyslipidemia    Low HDL, elevated TRIG   Skin cancer    a. Excised from back in  2012.   Sleep apnea    "resolved after losing alot of weight" (10/14/2012)    Assessment: IANN RODIER is a 77 y.o. year old male presented with chest pain on 12/31/2022 with concern for NSTEMI. No anticoagulation prior to admission. Pharmacy consulted to dose heparin.  Heparin level <0.10, subtherapeutic No issues with infusion or pauses with transfer. Drawn appropriately. Infusion currently running at 950 units/hr.   Goal of Therapy:  Heparin level 0.3-0.7 units/ml Monitor platelets by anticoagulation protocol: Yes   Plan:  Give 2000 unit heparin bolus x 1 Increase heparin infusion to 1,100 units/hr 8h heparin level Daily CBC, heparin level and monitoring for bleeding  Marja Kays, PharmD Clinical Pharmacist 01/01/2023,6:28 AM

## 2023-01-01 NOTE — Progress Notes (Signed)
PHARMACY - ANTICOAGULATION CONSULT NOTE  Pharmacy Consult for Heparin Infusion Indication: chest pain/ACS  Allergies  Allergen Reactions   Penicillins Rash    Patient Measurements: Height: 5\' 7"  (170.2 cm) Weight: 81.6 kg (180 lb) IBW/kg (Calculated) : 66.1 Heparin Dosing Weight: 81.6 kg   Vital Signs: Temp: 97.9 F (36.6 C) (11/05 1541) Temp Source: Oral (11/05 1541) BP: 145/86 (11/05 1541) Pulse Rate: 71 (11/05 1541)  Labs: Recent Labs    12/31/22 2007 12/31/22 2155 01/01/23 0447 01/01/23 1512  HGB 14.0  --  13.4  --   HCT 43.2  --  41.9  --   PLT 282  --  253  --   APTT 24  --   --   --   LABPROT 14.6  --  14.7  --   INR 1.1  --  1.1  --   HEPARINUNFRC  --   --  <0.10* 0.16*  CREATININE 1.68*  --  1.50*  --   TROPONINIHS 300* 401*  --   --     Estimated Creatinine Clearance: 41.5 mL/min (A) (by C-G formula based on SCr of 1.5 mg/dL (H)).   Medical History: Past Medical History:  Diagnosis Date   Acute renal insufficiency    a. Cr 1.48 in 01/2012, with resolution.   Bradycardia    a. HR 60's, fatigue in 05/2012 - beta blocker discontinued.   CAD (coronary artery disease)    a. NSTEMI 12/13 - LHC AV groove CFX 99% too small for PCI, o/w nonobst dz => med Rx. b. NSTEMI 09/2012: s/p PTCA/DES to PLOM of RCA, known AV groove Cx stenosis, o/w nonobstructive dz for med rx. EF 55-60%.   Carotid stenosis    a. dopplers 12/13: RICA < 40%   Dementia (HCC)    a. Early dementia, on Exelon. Mother had early onset dementia.   Diabetes mellitus type 2, diet-controlled (HCC)    HTN (hypertension)    Hx of echocardiogram    a. Echo 02/04/12: EF 50-55%, no wall motion abnormalities, grade 1 diastolic dysfunction, MAC, mild LAE, mild RVE with moderately reduced RV systolic function, mild RAE   Kidney stones    Leukocytosis    a. 09/2012 - for outpt repeat to ensure improvement.   Melanoma (HCC)    a. Exercised from head in 2012, did not spread.   Mixed dyslipidemia     Low HDL, elevated TRIG   Skin cancer    a. Excised from back in 2012.   Sleep apnea    "resolved after losing alot of weight" (10/14/2012)    Assessment: Paul Strickland is a 77 y.o. year old male presented with chest pain on 12/31/2022 with concern for NSTEMI. No anticoagulation prior to admission. Pharmacy consulted to dose heparin.  Heparin level 0.16 is subtherapeutic on 1100 units/hr.  No issues with infusion or bleeding per RN.  Goal of Therapy:  Heparin level 0.3-0.7 units/ml Monitor platelets by anticoagulation protocol: Yes   Plan:   Repeat 2000 unit bolus and increase heparin infusion to 1250 units/hr 8h heparin level Daily CBC, heparin level and monitoring for bleeding   Alphia Moh, PharmD, BCPS, BCCP Clinical Pharmacist  Please check AMION for all [facility] Pharmacy phone numbers After 10:00 PM, call Main Pharmacy [phone]

## 2023-01-01 NOTE — ED Notes (Signed)
Pt tx to Echo on transport monitor.

## 2023-01-01 NOTE — H&P (Signed)
Cardiology Admission History and Physical   Patient ID: Paul Strickland MRN: [id number]; DOB: [birth year]   Admission date: 12/31/2022  PCP:  Richardean Chimera, MD   [facility] Providers Cardiologist:  None   { PCP: Donzetta Sprung, MD  Chief Complaint:  Chest pain  History of Present Illness:   Paul Strickland is a 77 y.o. male with hypertension, hyperlipidemia, CAD (PCI to OM in 2014), Alzhiemer's dementia, admitted with chest pain, NSTEMI  Patient was transferred from [facility] ED last night after his presentation there with chest pain and shortness of breath lasting for an hour.  Pain initially happened on Sunday night, resolved on its own, and returned again on Monday evening.  Patient was hypertensive on presentation with BP 150s/100s, trop 400-->400, Cr 1.68--?1.5, WBC count 14k. EKG showed sinus rhythm, RBBB, without acute ischemic changes.  On my interview, patient was initially asleep, but then woke up and denied any chest pain or shortness of breath.  I detailed conversation with patient's wife [name], who is also healthcare power of attorney.  At baseline, patient has advanced Alzheimer's dementia, and needs help with all ADLs.  There are times where he does not recognize his wife.  Wife states that patient gets very "fidgety" and has tendency to pull out wires.  Currently, patient lives at home with his wife, who helps with all ADLs, 2 sons live close by.  There is no home health available currently.  Patient's PCP had talked to patient and his wife in May 2024 regarding planning for what [name] called " stage of life".  In our conversations, [name] did not think that he would be able to follow commands during procedure.  Past Medical History:  Diagnosis Date   Acute renal insufficiency    a. Cr 1.48 in 01/2012, with resolution.   Bradycardia    a. HR 60's, fatigue in 05/2012 - beta blocker discontinued.   CAD (coronary artery disease)    a. NSTEMI 12/13 - LHC AV  groove CFX 99% too small for PCI, o/w nonobst dz => med Rx. b. NSTEMI 09/2012: s/p PTCA/DES to PLOM of RCA, known AV groove Cx stenosis, o/w nonobstructive dz for med rx. EF 55-60%.   Carotid stenosis    a. dopplers 12/13: RICA < 40%   Dementia (HCC)    a. Early dementia, on Exelon. Mother had early onset dementia.   Diabetes mellitus type 2, diet-controlled (HCC)    HTN (hypertension)    Hx of echocardiogram    a. Echo 02/04/12: EF 50-55%, no wall motion abnormalities, grade 1 diastolic dysfunction, MAC, mild LAE, mild RVE with moderately reduced RV systolic function, mild RAE   Kidney stones    Leukocytosis    a. 09/2012 - for outpt repeat to ensure improvement.   Melanoma (HCC)    a. Exercised from head in 2012, did not spread.   Mixed dyslipidemia    Low HDL, elevated TRIG   Skin cancer    a. Excised from back in 2012.   Sleep apnea    "resolved after losing alot of weight" (10/14/2012)    Past Surgical History:  Procedure Laterality Date   BACK SURGERY     CARDIAC CATHETERIZATION  01/2012   CORONARY ANGIOPLASTY WITH STENT PLACEMENT  10/14/2012   "1" (10/14/2012)   LEFT HEART CATHETERIZATION WITH CORONARY ANGIOGRAM N/A 02/04/2012   Procedure: LEFT HEART CATHETERIZATION WITH CORONARY ANGIOGRAM;  Surgeon: Kathleene Hazel, MD;  Location: [facility] CATH LAB;  Service: Cardiovascular;  Laterality: N/A;   LEFT HEART CATHETERIZATION WITH CORONARY ANGIOGRAM N/A 10/14/2012   Procedure: LEFT HEART CATHETERIZATION WITH CORONARY ANGIOGRAM;  Surgeon: Peter M Swaziland, MD;  Location: [facility] CATH LAB;  Service: Cardiovascular;  Laterality: N/A;   LITHOTRIPSY  1990's   "5 times" (10/14/2012)   LUMBAR DISC SURGERY  ?1997   "fixed ruptured disc" (10/14/2012)   MELANOMA EXCISION  ?2012   "top of head" (10/14/2012)   PERCUTANEOUS CORONARY STENT INTERVENTION (PCI-S) Right 10/14/2012   Procedure: PERCUTANEOUS CORONARY STENT INTERVENTION (PCI-S);  Surgeon: Peter M Swaziland, MD;  Location[facility] CATH LAB;  Service:  Cardiovascular;  Laterality: Right;   SKIN CANCER EXCISION  2012   "back" (10/14/2012)     Medications Prior to Admission: Prior to Admission medications   Medication Sig Start Date End Date Taking? Authorizing Provider  aspirin (ASPIRIN EC) 81 MG EC tablet Take 81 mg by mouth daily. Swallow whole.   Yes [provider]  atorvastatin (LIPITOR) 80 MG tablet Take 1 tablet (80 mg total) by mouth daily. 02/05/12  Yes Barrett, Joline Salt, PA-C  clopidogrel (PLAVIX) 75 MG tablet Take 1 tablet (75 mg total) by mouth daily. 02/05/12  Yes Barrett, Joline Salt, PA-C  isosorbide mononitrate (IMDUR) 30 MG 24 hr tablet TAKE 1 TABLET BY MOUTH EVERY DAY 03/16/14  Yes Kathleene Hazel, MD  Rivastigmine 13.3 MG/24HR PT24 Place 1 patch onto the skin daily. exelon patch for memory   Yes [provider]     Allergies:    Allergies  Allergen Reactions   Penicillins Rash    Social History:   Social History   Socioeconomic History   Marital status: Married    Spouse name: Not on file   Number of children: Not on file   Years of education: Not on file   Highest education level: Not on file  Occupational History   Not on file  Tobacco Use   Smoking status: Former    Current packs/day: 0.00    Average packs/day: 0.1 packs/day for 4.0 years (0.5 ttl pk-yrs)    Types: Cigarettes    Start date: 02/03/1963    Quit date: 02/03/1967    Years since quitting: 55.9   Smokeless tobacco: Never   Tobacco comments:    As a teen occasionally  Substance and Sexual Activity   Alcohol use: Yes    Comment: 10/14/2012 "drank a little in the [employer]; none since"   Drug use: No   Sexual activity: Yes  Other Topics Concern   Not on file  Social History Narrative   Not on file   Social Determinants of Health   Financial Resource Strain: Not on file  Food Insecurity: Not on file  Transportation Needs: Not on file  Physical Activity: Not on file  Stress: Not on file  Social Connections: Not  on file  Intimate Partner Violence: Not on file    Family History:   The patient's family history includes CAD in his father; Dementia in his mother.    ROS:  Please see the history of present illness.  All other ROS reviewed and negative.     Physical Exam/Data:   Vitals:   01/01/23 0200 01/01/23 0400 01/01/23 0500 01/01/23 0803  BP: (!) 143/95 (!) 145/88 138/86 (!) 158/90  Pulse: (!) 52 (!) 56 62 64  Resp: 13 11 14 14   Temp:  98 F (36.7 C) 98 F (36.7 C)   TempSrc:  Oral    SpO2:  99% 97% 98% 97%  Weight:      Height:       No intake or output data in the 24 hours ending 01/01/23 0826    12/31/2022    9:39 PM 02/03/2013    3:53 PM 12/11/2012   10:17 AM  Last 3 Weights  Weight (lbs) 180 lb 216 lb 213 lb 9.6 oz  Weight (kg) 81.647 kg 97.977 kg 96.888 kg     Body mass index is 28.19 kg/m.   Physical Exam Vitals and nursing note reviewed.  Constitutional:      General: He is not in acute distress. Neck:     Vascular: No JVD.  Cardiovascular:     Rate and Rhythm: Normal rate and regular rhythm.     Heart sounds: Normal heart sounds. No murmur heard. Pulmonary:     Effort: Pulmonary effort is normal.     Breath sounds: Normal breath sounds. No wheezing or rales.  Musculoskeletal:     Right lower leg: No edema.     Left lower leg: No edema.      EKG:  The ECG that was done 12/31/2022 was personally reviewed and demonstrates  Sinus rhythm, RBBB, no acute ischemic changes  Relevant CV Studies: No recent studies since 01-28-2013  Laboratory Data:  High Sensitivity Troponin:   Recent Labs  Lab 12/31/22 2007 12/31/22 01-28-54  TROPONINIHS 300* 401*      Chemistry Recent Labs  Lab 12/31/22 01/28/06 01/01/23 0447  NA 139 139  K 4.1 4.0  CL 105 108  CO2 22 21*  GLUCOSE 157* 120*  BUN 23 18  CREATININE 1.68* 1.50*  CALCIUM 8.8* 8.4*  GFRNONAA 41* 47*  ANIONGAP 12 10    Recent Labs  Lab 12/31/22 2007  PROT 7.3  ALBUMIN 3.6  AST 14*  ALT 13  ALKPHOS 68   BILITOT 0.5   Lipids  Recent Labs  Lab 01/01/23 0447  CHOL 168  TRIG 148  HDL 31*  LDLCALC 107*  CHOLHDL 5.4   Hematology Recent Labs  Lab 12/31/22 01/28/2006 01/01/23 0447  WBC 14.4* 13.2*  RBC 4.89 4.68  HGB 14.0 13.4  HCT 43.2 41.9  MCV 88.3 89.5  MCH 28.6 28.6  MCHC 32.4 32.0  RDW 13.6 13.4  PLT 282 253   Thyroid No results for input(s): "TSH", "FREET4" in the last 168 hours. BNPNo results for input(s): "BNP", "PROBNP" in the last 168 hours.  DDimer No results for input(s): "DDIMER" in the last 168 hours.   Radiology/Studies:  DG Chest Port 1 View  Result Date: 12/31/2022 CLINICAL DATA:  Dyspnea, hypertension EXAM: PORTABLE CHEST 1 VIEW COMPARISON:  10/14/2012 FINDINGS: Lungs are well expanded, symmetric, and clear. No pneumothorax or pleural effusion. Cardiac size within normal limits. Pulmonary vascularity is normal. Osseous structures are age-appropriate. No acute bone abnormality. IMPRESSION: No active disease. Electronically Signed   By: Helyn Numbers M.D.   On: 12/31/2022 20:44     Assessment and Plan:   77 y.o. male with hypertension, hyperlipidemia, CAD (PCI to OM in 2013/01/28), Alzhiemer's dementia, admitted with chest pain, NSTEMI  NSTEMI: Currently chest pain-free.  Trop HS 300-->400 Continue aspirin, heparin, statin. Patient already on home Plavix 75 mg, okay to continue the same. Continue home medication Imdur 30 mg daily. Given his advanced Alzheimer's dementia, I do not think he will be a good candidate for invasive procedure with risk of periprocedural complications, knowing that this would not change his baseline advanced dementia. After detailed  discussion with the patient's wife, we have mutually decided not to pursue invasive therapy. Fortunately, patient is currently chest pain-free, has minimal troponin elevation, no heart failure on exam. We will continue IV heparin till 11/6 morning. Heart rate in 60s, precluding use of beta-blockers. I will add  amlodipine as antihypertensive and antianginal agent.  Dementia: Known advanced Alzheimer's dementia. On rivastigmine patch at home. Monitor for hospital delirium or sundowning.  Discharge planning: I will get case management involved to assess home health needs.  CODE STATUS discussion: Patient's wife [name] echoed patient's baseline wishes of "not wanting to be on machines to keep him alive".  After mutual discussion, we agreed on placing DNR orders. Will continue medical management. [name] will talk to her sons about discussing palliative care possibility.   Risk Assessment/Risk Scores:   TIMI Risk Score for Unstable Angina or Non-ST Elevation MI:   The patient's TIMI risk score is 6, which indicates a 41% risk of all cause mortality, new or recurrent myocardial infarction or need for urgent revascularization in the next 14 days.{     Code Status: Do Not Resuscitate (DNR)  Severity of Illness: The appropriate patient status for this patient is INPATIENT. Inpatient status is judged to be reasonable and necessary in order to provide the required intensity of service to ensure the patient's safety. The patient's presenting symptoms, physical exam findings, and initial radiographic and laboratory data in the context of their chronic comorbidities is felt to place them at high risk for further clinical deterioration. Furthermore, it is not anticipated that the patient will be medically stable for discharge from the hospital within 2 midnights of admission.   * I certify that at the point of admission it is my clinical judgment that the patient will require inpatient hospital care spanning beyond 2 midnights from the point of admission due to high intensity of service, high risk for further deterioration and high frequency of surveillance required.*   For questions or updates, please contact [facility] Please consult [email or web address] for contact info under     Signed, Elder Negus, MD 01/01/2023 8:26 AM

## 2023-01-02 ENCOUNTER — Inpatient Hospital Stay (HOSPITAL_COMMUNITY): Payer: PPO

## 2023-01-02 ENCOUNTER — Other Ambulatory Visit (HOSPITAL_COMMUNITY): Payer: Self-pay

## 2023-01-02 ENCOUNTER — Encounter (HOSPITAL_COMMUNITY): Payer: Self-pay

## 2023-01-02 DIAGNOSIS — I10 Essential (primary) hypertension: Secondary | ICD-10-CM | POA: Diagnosis not present

## 2023-01-02 DIAGNOSIS — I214 Non-ST elevation (NSTEMI) myocardial infarction: Secondary | ICD-10-CM | POA: Diagnosis not present

## 2023-01-02 DIAGNOSIS — E782 Mixed hyperlipidemia: Secondary | ICD-10-CM

## 2023-01-02 DIAGNOSIS — I2609 Other pulmonary embolism with acute cor pulmonale: Secondary | ICD-10-CM

## 2023-01-02 LAB — CBC
HCT: 41.5 % (ref 39.0–52.0)
Hemoglobin: 13.8 g/dL (ref 13.0–17.0)
MCH: 28.3 pg (ref 26.0–34.0)
MCHC: 33.3 g/dL (ref 30.0–36.0)
MCV: 85.2 fL (ref 80.0–100.0)
Platelets: 280 10*3/uL (ref 150–400)
RBC: 4.87 MIL/uL (ref 4.22–5.81)
RDW: 13 % (ref 11.5–15.5)
WBC: 18.6 10*3/uL — ABNORMAL HIGH (ref 4.0–10.5)
nRBC: 0 % (ref 0.0–0.2)

## 2023-01-02 LAB — BASIC METABOLIC PANEL
Anion gap: 8 (ref 5–15)
BUN: 17 mg/dL (ref 8–23)
CO2: 23 mmol/L (ref 22–32)
Calcium: 8.6 mg/dL — ABNORMAL LOW (ref 8.9–10.3)
Chloride: 102 mmol/L (ref 98–111)
Creatinine, Ser: 1.56 mg/dL — ABNORMAL HIGH (ref 0.61–1.24)
GFR, Estimated: 45 mL/min — ABNORMAL LOW (ref >60)
Glucose, Bld: 174 mg/dL — ABNORMAL HIGH (ref 70–99)
Potassium: 4.1 mmol/L (ref 3.5–5.1)
Sodium: 133 mmol/L — ABNORMAL LOW (ref 135–145)

## 2023-01-02 LAB — HEPARIN LEVEL (UNFRACTIONATED)
Heparin Unfractionated: 0.37 [IU]/mL (ref 0.30–0.70)
Heparin Unfractionated: 0.27 [IU]/mL — ABNORMAL LOW (ref 0.30–0.70)

## 2023-01-02 LAB — LIPOPROTEIN A (LPA): Lipoprotein (a): 10.5 nmol/L (ref <75.0)

## 2023-01-02 LAB — MRSA NEXT GEN BY PCR, NASAL: MRSA by PCR Next Gen: NOT DETECTED

## 2023-01-02 LAB — APTT: aPTT: 56 s — ABNORMAL HIGH (ref 24–36)

## 2023-01-02 LAB — LACTIC ACID, PLASMA: Lactic Acid, Venous: 1.9 mmol/L (ref 0.5–1.9)

## 2023-01-02 MED ORDER — RIVAROXABAN 20 MG PO TABS: 20.0000 mg | ORAL_TABLET | Freq: Every day | ORAL | Status: DC

## 2023-01-02 MED ORDER — RIVAROXABAN 15 MG PO TABS
15.0000 mg | ORAL_TABLET | Freq: Two times a day (BID) | ORAL | Status: DC
  Administered 2023-01-02 – 2023-01-04 (×4): 15 mg via ORAL
  Filled 2023-01-02 (×5): qty 1

## 2023-01-02 MED ORDER — RIVAROXABAN 15 MG PO TABS
15.0000 mg | ORAL_TABLET | Freq: Two times a day (BID) | ORAL | Status: DC
  Filled 2023-01-02: qty 1

## 2023-01-02 MED ORDER — IOHEXOL 350 MG/ML SOLN
75.0000 mL | Freq: Once | INTRAVENOUS | Status: AC | PRN
  Administered 2023-01-02: 75 mL via INTRAVENOUS

## 2023-01-02 NOTE — Plan of Care (Signed)

## 2023-01-02 NOTE — Progress Notes (Addendum)
Patient Name: Paul Strickland Date of Encounter: 01/02/2023 [facility] Cardiologist: None   Interval Summary  .    No chest pain Feels okay  Vital Signs .    Vitals:   01/01/23 2300 01/02/23 0437 01/02/23 0751 01/02/23 0802  BP: (!) 156/95 (!) 142/81 137/83 (!) 141/80  Pulse: 75 83 86   Resp: (!) 25 17 17    Temp:  99.3 F (37.4 C) 98.7 F (37.1 C)   TempSrc:  Axillary Axillary   SpO2: 95% 95% 98%   Weight:      Height:        Intake/Output Summary (Last 24 hours) at 01/02/2023 0954 Last data filed at 01/02/2023 0750 Gross per 24 hour  Intake 120 ml  Output 1250 ml  Net -1130 ml      12/31/2022    9:39 PM 02/03/2013    3:53 PM 12/11/2012   10:17 AM  Last 3 Weights  Weight (lbs) 180 lb 216 lb 213 lb 9.6 oz  Weight (kg) 81.647 kg 97.977 kg 96.888 kg      Telemetry/ECG    01/02/2023 - Personally Reviewed: No significant arrhthymias  Echocardiogram 01/01/2023: Personally reviewed and independently interpreted by me LVEF 45-50%, difficult to evaluate regional wall motion Moderate enlarged RV with normal systolic function. No significant valvular abnormality.  Detailed report below:  1. Left ventricular ejection fraction, by estimation, is 45 to 50%. The  left ventricle has mildly decreased function. Left ventricular endocardial  border not optimally defined to evaluate regional wall motion. Left  ventricular diastolic parameters are  consistent with Grade I diastolic dysfunction (impaired relaxation).   2. Right ventricular systolic function is mildly reduced. The right  ventricular size is moderately enlarged. There is normal pulmonary artery  systolic pressure. The estimated right ventricular systolic pressure is  21.5 mmHg.   3. The mitral valve is grossly normal. No evidence of mitral valve  regurgitation. No evidence of mitral stenosis.   4. The aortic valve is grossly normal. Aortic valve regurgitation is not  visualized. No aortic stenosis  is present.   5. The inferior vena cava is normal in size with greater than 50%  respiratory variability, suggesting right atrial pressure of 3 mmHg.   Physical Exam .   Physical Exam Vitals and nursing note reviewed.  Constitutional:      General: He is not in acute distress. Neck:     Vascular: No JVD.  Cardiovascular:     Rate and Rhythm: Normal rate and regular rhythm.     Heart sounds: Normal heart sounds. No murmur heard. Pulmonary:     Effort: Pulmonary effort is normal.     Breath sounds: Normal breath sounds. No wheezing or rales.  Musculoskeletal:     Right lower leg: No edema.     Left lower leg: No edema.      Assessment & Plan .     77 y.o. male with hypertension, hyperlipidemia, CAD (PCI to OM in 2014), dementia, admitted with chest pain, trop elevation  Chest pain: Currently absent. Mild trop elevation, thought to be NSTEMI. Given his age and advanced dementia, after discussion with his wife, we had mutually opted for medical therapy. He has been on Aspirin, statin, IV heparin. EF is mildly reduced, RV is moderately dilated. This is suspicious for PE.  Will obtain CTA PE protocol. Will hold Plavix and Imdur till CTA is completed.  Hyperlipidemia: Continue Lipitor 80 mg daily.  Hypertension: Continue amlodipine 5  mg daily. Will add metoprolol succinate 25 mg daily, if CTA negative for PE.  Anticipate discharge today or tomorrow pending CTA results.   For questions or updates, please contact [facility] Please consult [email or web address] for contact info under        Signed, Elder Negus, MD

## 2023-01-02 NOTE — Progress Notes (Signed)
I was informed by radiologist Dr. Fredirick Lathe regarding finding of acute PE with right heart strain consistent with early submassive (intermediate risk) PE.  There is also probable small infarct in the posterior right lower lobe.  Presented finding of left renal stones with inadequate visualization to exclude hydronephrosis.  Currently, patient is hemodynamically stable, does not have any chest pain.  Given his dementia I do not think he is candidate for any invasive treatment options for PE.  Will continue anticoagulation with IV heparin, transition to DOAC.  Elevation is secondary to PE, not due to ACS.  Will discontinue Plavix.  Okay to continue 81 mg aspirin.  Will discontinue Imdur and metoprolol, okay to continue amlodipine if SBP >140 mmHg.  Will check lower extremity DVT ultrasound.   Elder Negus, MD

## 2023-01-02 NOTE — Progress Notes (Signed)
PHARMACY - ANTICOAGULATION CONSULT NOTE  Pharmacy Consult for Heparin Infusion Indication: chest pain/ACS  Allergies  Allergen Reactions   Penicillins Rash    Patient Measurements: Height: 5\' 7"  (170.2 cm) Weight: 81.6 kg (180 lb) IBW/kg (Calculated) : 66.1 Heparin Dosing Weight: 81.6 kg   Vital Signs: Temp: 98.2 F (36.8 C) (11/05 1900) Temp Source: Oral (11/05 1900) BP: 156/95 (11/05 2300) Pulse Rate: 75 (11/05 2300)  Labs: Recent Labs    12/31/22 2007 12/31/22 2155 01/01/23 0447 01/01/23 1512 01/02/23 0233  HGB 14.0  --  13.4  --  13.8  HCT 43.2  --  41.9  --  41.5  PLT 282  --  253  --  280  APTT 24  --   --   --   --   LABPROT 14.6  --  14.7  --   --   INR 1.1  --  1.1  --   --   HEPARINUNFRC  --   --  <0.10* 0.16* 0.37  CREATININE 1.68*  --  1.50*  --   --   TROPONINIHS 300* 401*  --   --   --     Estimated Creatinine Clearance: 41.5 mL/min (A) (by C-G formula based on SCr of 1.5 mg/dL (H)).   Medical History: Past Medical History:  Diagnosis Date   Acute renal insufficiency    a. Cr 1.48 in 01/2012, with resolution.   Bradycardia    a. HR 60's, fatigue in 05/2012 - beta blocker discontinued.   CAD (coronary artery disease)    a. NSTEMI 12/13 - LHC AV groove CFX 99% too small for PCI, o/w nonobst dz => med Rx. b. NSTEMI 09/2012: s/p PTCA/DES to PLOM of RCA, known AV groove Cx stenosis, o/w nonobstructive dz for med rx. EF 55-60%.   Carotid stenosis    a. dopplers 12/13: RICA < 40%   Dementia (HCC)    a. Early dementia, on Ex[employer]. Mother had early onset dementia.   Diabetes mellitus type 2, diet-controlled (HCC)    HTN (hypertension)    Hx of echocardiogram    a. Echo 02/04/12: EF 50-55%, no wall motion abnormalities, grade 1 diastolic dysfunction, MAC, mild LAE, mild RVE with moderately reduced RV systolic function, mild RAE   Kidney stones    Leukocytosis    a. 09/2012 - for outpt repeat to ensure improvement.   Melanoma (HCC)    a. Exercised from  head in 2012, did not spread.   Mixed dyslipidemia    Low HDL, elevated TRIG   Skin cancer    a. Excised from back in 2012.   Sleep apnea    "resolved after losing alot of weight" (10/14/2012)    Assessment: Paul Strickland is a 76 y.o. year old male presented with chest pain on 12/31/2022 with concern for NSTEMI. No anticoagulation prior to admission. Pharmacy consulted to dose heparin.  Heparin level 0.16 is subtherapeutic on 1100 units/hr.  No issues with infusion or bleeding per RN.  11/6 AM update:  Heparin level therapeutic after rate increase  Goal of Therapy:  Heparin level 0.3-0.7 units/ml Monitor platelets by anticoagulation protocol: Yes   Plan:   Cont heparin 1250 units/hr Heparin level in 6-8 hours  Abran Duke, PharmD, BCPS Clinical Pharmacist Phone: [phone]

## 2023-01-02 NOTE — Progress Notes (Signed)
PHARMACY - ANTICOAGULATION CONSULT NOTE  Pharmacy Consult for Heparin >>rivaroxaban Indication: chest pain/ACS  Allergies  Allergen Reactions   Penicillins Rash    Patient Measurements: Height: 5\' 7"  (170.2 cm) Weight: 81.6 kg (180 lb) IBW/kg (Calculated) : 66.1 Heparin Dosing Weight: 81.6 kg   Vital Signs: Temp: 98 F (36.7 C) (11/06 1521) Temp Source: Oral (11/06 1521) BP: 157/92 (11/06 1521) Pulse Rate: 88 (11/06 1521)  Labs: Recent Labs    12/31/22 2007 12/31/22 2007 12/31/22 2155 01/01/23 0447 01/01/23 1512 01/02/23 0233 01/02/23 1327 01/02/23 1542  HGB 14.0  --   --  13.4  --  13.8  --   --   HCT 43.2  --   --  41.9  --  41.5  --   --   PLT 282  --   --  253  --  280  --   --   APTT 24  --   --   --   --   --   --  56*  LABPROT 14.6  --   --  14.7  --   --   --   --   INR 1.1  --   --  1.1  --   --   --   --   HEPARINUNFRC  --    < >  --  <0.10* 0.16* 0.37 0.27*  --   CREATININE 1.68*  --   --  1.50*  --   --  1.56*  --   TROPONINIHS 300*  --  401*  --   --   --   --   --    < > = values in this interval not displayed.    Estimated Creatinine Clearance: 39.9 mL/min (A) (by C-G formula based on SCr of 1.56 mg/dL (H)).   Medical History: Past Medical History:  Diagnosis Date   Acute renal insufficiency    a. Cr 1.48 in 01/2012, with resolution.   Bradycardia    a. HR 60's, fatigue in 05/2012 - beta blocker discontinued.   CAD (coronary artery disease)    a. NSTEMI 12/13 - LHC AV groove CFX 99% too small for PCI, o/w nonobst dz => med Rx. b. NSTEMI 09/2012: s/p PTCA/DES to PLOM of RCA, known AV groove Cx stenosis, o/w nonobstructive dz for med rx. EF 55-60%.   Carotid stenosis    a. dopplers 12/13: RICA < 40%   Dementia (HCC)    a. Early dementia, on Ex[employer]. Mother had early onset dementia.   Diabetes mellitus type 2, diet-controlled (HCC)    HTN (hypertension)    Hx of echocardiogram    a. Echo 02/04/12: EF 50-55%, no wall motion abnormalities, grade  1 diastolic dysfunction, MAC, mild LAE, mild RVE with moderately reduced RV systolic function, mild RAE   Kidney stones    Leukocytosis    a. 09/2012 - for outpt repeat to ensure improvement.   Melanoma (HCC)    a. Exercised from head in 2012, did not spread.   Mixed dyslipidemia    Low HDL, elevated TRIG   Skin cancer    a. Excised from back in 2012.   Sleep apnea    "resolved after losing alot of weight" (10/14/2012)    Assessment: Paul Strickland is a 77 y.o. year old male presented with chest pain on 12/31/2022 with concern for NSTEMI and found to have submassive PE with right heart strain. No anticoagulation prior to admission. Pharmacy consulted to dose heparin  to rivaroxaban.  ClCr 40 ml/min.   Goal of Therapy:  Heparin level 0.3-0.7 units/ml Monitor platelets by anticoagulation protocol: Yes   Plan:   Rivaroxaban 15mg  BID for 21 days then 20mg  daily with dinner Stop heparin  Monitor renal function, signs/symptoms of bleeding   Alphia Moh, PharmD, BCPS, BCCP Clinical Pharmacist  Please check AMION for all [facility] phone numbers After 10:00 PM, call Main Pharmacy [phone]

## 2023-01-02 NOTE — TOC Benefit Eligibility Note (Signed)
Patient Product/process development scientist completed.    The patient is insured through [employer]/ [employer]. Patient has Medicare and is not eligible for a copay card, but may be able to apply for patient assistance, if available.    Ran test claim for Eliquis 5 mg and the current 30 day co-pay is $47.00.  Ran test claim for Xarelto 20 mg and the current 30 day co-pay is $47.00.  This test claim was processed through [facility]- copay amounts may vary at other pharmacies due to pharmacy/plan contracts, or as the patient moves through the different stages of their insurance plan.     Roland Earl, CPHT Pharmacy Technician III Certified Patient Advocate [facility] Patient Advocate Team Direct Number: [phone]  Fax: [phone]

## 2023-01-02 NOTE — Progress Notes (Signed)
PHARMACY - ANTICOAGULATION CONSULT NOTE  Pharmacy Consult for Heparin Infusion Indication: chest pain/ACS  Allergies  Allergen Reactions   Penicillins Rash    Patient Measurements: Height: 5\' 7"  (170.2 cm) Weight: 81.6 kg (180 lb) IBW/kg (Calculated) : 66.1 Heparin Dosing Weight: 81.6 kg   Vital Signs: Temp: 98.2 F (36.8 C) (11/06 1112) Temp Source: Oral (11/06 1112) BP: 129/75 (11/06 1112) Pulse Rate: 74 (11/06 1112)  Labs: Recent Labs    12/31/22 2007 12/31/22 2007 12/31/22 2155 01/01/23 0447 01/01/23 1512 01/02/23 0233 01/02/23 1327  HGB 14.0  --   --  13.4  --  13.8  --   HCT 43.2  --   --  41.9  --  41.5  --   PLT 282  --   --  253  --  280  --   APTT 24  --   --   --   --   --   --   LABPROT 14.6  --   --  14.7  --   --   --   INR 1.1  --   --  1.1  --   --   --   HEPARINUNFRC  --    < >  --  <0.10* 0.16* 0.37 0.27*  CREATININE 1.68*  --   --  1.50*  --   --  1.56*  TROPONINIHS 300*  --  401*  --   --   --   --    < > = values in this interval not displayed.    Estimated Creatinine Clearance: 39.9 mL/min (A) (by C-G formula based on SCr of 1.56 mg/dL (H)).   Medical History: Past Medical History:  Diagnosis Date   Acute renal insufficiency    a. Cr 1.48 in 01/2012, with resolution.   Bradycardia    a. HR 60's, fatigue in 05/2012 - beta blocker discontinued.   CAD (coronary artery disease)    a. NSTEMI 12/13 - LHC AV groove CFX 99% too small for PCI, o/w nonobst dz => med Rx. b. NSTEMI 09/2012: s/p PTCA/DES to PLOM of RCA, known AV groove Cx stenosis, o/w nonobstructive dz for med rx. EF 55-60%.   Carotid stenosis    a. dopplers 12/13: RICA < 40%   Dementia (HCC)    a. Early dementia, on [employer]. Mother had early onset dementia.   Diabetes mellitus type 2, diet-controlled (HCC)    HTN (hypertension)    Hx of echocardiogram    a. Echo 02/04/12: EF 50-55%, no wall motion abnormalities, grade 1 diastolic dysfunction, MAC, mild LAE, mild RVE with  moderately reduced RV systolic function, mild RAE   Kidney stones    Leukocytosis    a. 09/2012 - for outpt repeat to ensure improvement.   Melanoma (HCC)    a. Exercised from head in 2012, did not spread.   Mixed dyslipidemia    Low HDL, elevated TRIG   Skin cancer    a. Excised from back in 2012.   Sleep apnea    "resolved after losing alot of weight" (10/14/2012)    Assessment: Paul Strickland is a 77 y.o. year old male presented with chest pain on 12/31/2022 with concern for NSTEMI. No anticoagulation prior to admission. Pharmacy consulted to dose heparin.  -Heparin level 0.27 is subtherapeutic on 1250 units/hr.  -plans are for r/o PE  Goal of Therapy:  Heparin level 0.3-0.7 units/ml Monitor platelets by anticoagulation protocol: Yes   Plan:   -Increase heparin  to 1400 units/hr -Heparin level and CBC in am  Harland German, PharmD Clinical Pharmacist **Pharmacist phone directory can now be found on [email or web address] ([email or web address]).  Listed under [facility].

## 2023-01-02 NOTE — Progress Notes (Addendum)
OT Cancellation Note  Patient Details Name: Paul Strickland MRN: [id number] DOB: 11-21-1945   Cancelled Treatment:    Reason Eval/Treat Not Completed: Other (comment) (OT imminent order recieved, per MD pt awaiting CT chest to rule out PE.  MD asking to hold evaluation until CT is clear. Will follow up for OT evaluation as appropriate)  1512: CT chest imaging reveals PE, will follow up for OT evaluation as medically appropriate.  Paul Strickland, OTR/L [facility] [phone]   Dalphine Handing 01/02/2023, 10:17 AM

## 2023-01-02 NOTE — Progress Notes (Signed)
PT Cancellation Note  Patient Details Name: Paul Strickland MRN: [id number] DOB: January 29, 1946   Cancelled Treatment:    Reason Eval/Treat Not Completed: Other (comment). PT imminent order recieved, per MD pt awaiting CT chest to rule out PE. MD asking to hold evaluation until CT is clear. Will follow up for PT evaluation as appropriate)    Arlyss Gandy 01/02/2023, 11:07 AM

## 2023-01-03 ENCOUNTER — Other Ambulatory Visit (HOSPITAL_COMMUNITY): Payer: Self-pay

## 2023-01-03 ENCOUNTER — Inpatient Hospital Stay (HOSPITAL_COMMUNITY): Payer: PPO

## 2023-01-03 DIAGNOSIS — I2603 Cement embolism of pulmonary artery with acute cor pulmonale: Secondary | ICD-10-CM | POA: Diagnosis not present

## 2023-01-03 DIAGNOSIS — T8481XA Embolism due to internal orthopedic prosthetic devices, implants and grafts, initial encounter: Secondary | ICD-10-CM

## 2023-01-03 DIAGNOSIS — Z86711 Personal history of pulmonary embolism: Secondary | ICD-10-CM | POA: Diagnosis not present

## 2023-01-03 DIAGNOSIS — Z1883 Retained stone or crystalline fragments: Secondary | ICD-10-CM | POA: Diagnosis not present

## 2023-01-03 LAB — BASIC METABOLIC PANEL
Anion gap: 11 (ref 5–15)
BUN: 17 mg/dL (ref 8–23)
CO2: 23 mmol/L (ref 22–32)
Calcium: 8.6 mg/dL — ABNORMAL LOW (ref 8.9–10.3)
Chloride: 99 mmol/L (ref 98–111)
Creatinine, Ser: 1.52 mg/dL — ABNORMAL HIGH (ref 0.61–1.24)
GFR, Estimated: 47 mL/min — ABNORMAL LOW (ref >60)
Glucose, Bld: 135 mg/dL — ABNORMAL HIGH (ref 70–99)
Potassium: 3.8 mmol/L (ref 3.5–5.1)
Sodium: 133 mmol/L — ABNORMAL LOW (ref 135–145)

## 2023-01-03 LAB — CBC
HCT: 38.6 % — ABNORMAL LOW (ref 39.0–52.0)
Hemoglobin: 12.9 g/dL — ABNORMAL LOW (ref 13.0–17.0)
MCH: 28.2 pg (ref 26.0–34.0)
MCHC: 33.4 g/dL (ref 30.0–36.0)
MCV: 84.5 fL (ref 80.0–100.0)
Platelets: 278 10*3/uL (ref 150–400)
RBC: 4.57 MIL/uL (ref 4.22–5.81)
RDW: 12.9 % (ref 11.5–15.5)
WBC: 16.1 10*3/uL — ABNORMAL HIGH (ref 4.0–10.5)
nRBC: 0 % (ref 0.0–0.2)

## 2023-01-03 MED ORDER — RIVAROXABAN (XARELTO) VTE STARTER PACK (15 & 20 MG)
ORAL_TABLET | ORAL | 0 refills | Status: DC
  Filled 2023-01-03: qty 51, 30d supply, fill #0

## 2023-01-03 MED ORDER — AMLODIPINE BESYLATE 5 MG PO TABS
5.0000 mg | ORAL_TABLET | Freq: Every day | ORAL | 2 refills | Status: DC
  Filled 2023-01-03: qty 30, 30d supply, fill #0

## 2023-01-03 NOTE — Discharge Summary (Addendum)
Discharge Summary    Patient ID: Paul Strickland MRN: [id number]; DOB: March 01, 1945  Admit date: 12/31/2022 Discharge date: 01/03/2023  PCP:  Richardean Chimera, MD   [facility] Providers Cardiologist:  Elder Negus, MD     Discharge Diagnoses    Principal Problem:   Pulmonary embolism with acute cor pulmonale (HCC) Active Problems:   HTN (hypertension)   Mixed dyslipidemia   Moderate Alzheimer's dementia without behavioral disturbance, psychotic disturbance, mood disturbance, or anxiety (HCC)   Diagnostic Studies/Procedures    Echo: 01/01/2023  IMPRESSIONS     1. Left ventricular ejection fraction, by estimation, is 45 to 50%. The  left ventricle has mildly decreased function. Left ventricular endocardial  border not optimally defined to evaluate regional wall motion. Left  ventricular diastolic parameters are  consistent with Grade I diastolic dysfunction (impaired relaxation).   2. Right ventricular systolic function is mildly reduced. The right  ventricular size is moderately enlarged. There is normal pulmonary artery  systolic pressure. The estimated right ventricular systolic pressure is  21.5 mmHg.   3. The mitral valve is grossly normal. No evidence of mitral valve  regurgitation. No evidence of mitral stenosis.   4. The aortic valve is grossly normal. Aortic valve regurgitation is not  visualized. No aortic stenosis is present.   5. The inferior vena cava is normal in size with greater than 50%  respiratory variability, suggesting right atrial pressure of 3 mmHg.   FINDINGS   Left Ventricle: Left ventricular ejection fraction, by estimation, is 45  to 50%. The left ventricle has mildly decreased function. Left ventricular  endocardial border not optimally defined to evaluate regional wall motion.  The left ventricular internal  cavity size was normal in size. There is no left ventricular hypertrophy.  Abnormal (paradoxical) septal motion,  consistent with left bundle branch  block. Left ventricular diastolic parameters are consistent with Grade I  diastolic dysfunction (impaired  relaxation).   Right Ventricle: The right ventricular size is moderately enlarged. No  increase in right ventricular wall thickness. Right ventricular systolic  function is mildly reduced. There is normal pulmonary artery systolic  pressure. The tricuspid regurgitant  velocity is 2.15 m/s, and with an assumed right atrial pressure of 3 mmHg,  the estimated right ventricular systolic pressure is 21.5 mmHg.   Left Atrium: Left atrial size was normal in size.   Right Atrium: Right atrial size was normal in size.   Pericardium: There is no evidence of pericardial effusion. Presence of  epicardial fat layer.   Mitral Valve: The mitral valve is grossly normal. No evidence of mitral  valve regurgitation. No evidence of mitral valve stenosis.   Tricuspid Valve: The tricuspid valve is grossly normal. Tricuspid valve  regurgitation is trivial. No evidence of tricuspid stenosis.   Aortic Valve: The aortic valve is grossly normal. Aortic valve  regurgitation is not visualized. No aortic stenosis is present.   Pulmonic Valve: The pulmonic valve was grossly normal. Pulmonic valve  regurgitation is not visualized. No evidence of pulmonic stenosis.   Aorta: The aortic root was not well visualized.   Venous: The inferior vena cava is normal in size with greater than 50%  respiratory variability, suggesting right atrial pressure of 3 mmHg.   IAS/Shunts: The atrial septum is grossly normal.      _____________   History of Present Illness     Paul Strickland is a 77 y.o. male with a hx of CAD s/p DES to  OM in 2014, HTN, HL, dementia who was seen 12/31/2022 for the evaluation of NSTEMI at the request of [name]. He had intermittent chest pain the day prior to admission followed by persistent pain lasting an hour with shortness of breath today.  Located left side of chest, non radiating, self resolved. He had DES x 1 to OM placed in 09/2012 in the setting of an NSTEMI. Last TTE 01/2012 showed mildly reduced EF, otherwise unremarkable.   In the ED, he was hypertensive to 150/100s. Labs notable for Cr 1.7 from normal baseline, WBC 14.4, troponin 300 -> 401, EKG in NSR with RBBB, without ischemic changes. He was given ASA load, continued on home plavix, and started on heparin. Admitted to cardiology for further management.   Hospital Course     Chest pain  NSTEMI Early submassive PE/probable small infarct in posterior RLL -- presented with chest pain and found to have elevated troponin 300>>401. After MD discussion with the patients wife, decision was made to treat medically with no plan for ischemic evaluation -- echo showed LVEF of 45-50%, g1DD, moderately RV enlargement  -- underwent CT angio with acute PE, evidence of right heart strain. 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   Hypertension -- Continue amlodipine 5 mg daily -- Imdur/metoprolol stopped   Dementia Deconditioned -- Known advanced Alzheimer's dementia. -- On rivastigmine patch at home  -- seen by PT/OT with recs for home health, ordered placed at discharge   Patient was seen by [name] and deemed stable for discharge home. Follow up arranged in the office. Medications sent to [facility] pharmacy  Discharge Vitals Blood pressure 115/60, pulse 78, temperature 98.4 F (36.9 C), temperature source Oral, resp. rate 17, height 5\' 7"  (1.702 m),  weight 81.6 kg, SpO2 96%.  Filed Weights   12/31/22 2139  Weight: 81.6 kg    Labs & Radiologic Studies    CBC Recent Labs    01/02/23 0233 01/03/23 0236  WBC 18.6* 16.1*  HGB 13.8 12.9*  HCT 41.5 38.6*  MCV 85.2 84.5  PLT 280 278   Basic Metabolic Panel Recent Labs    08/65/78 1327 01/03/23 0236  NA 133* 133*  K 4.1 3.8  CL 102 99  CO2 23 23  GLUCOSE 174* 135*  BUN 17 17  CREATININE 1.56* 1.52*  CALCIUM 8.6* 8.6*   Liver Function Tests Recent Labs    12/31/22 2007  AST 14*  ALT 13  ALKPHOS 68  BILITOT 0.5  PROT 7.3  ALBUMIN 3.6   No results for input(s): "LIPASE", "AMYLASE" in the last 72 hours. High Sensitivity Troponin:   Recent Labs  Lab 12/31/22 2007 12/31/22 2155  TROPONINIHS 300* 401*    BNP Invalid input(s): "POCBNP" D-Dimer No results for input(s): "DDIMER" in the last 72 hours. Hemoglobin A1C No results for input(s): "HGBA1C" in the last 72 hours. Fasting Lipid Panel Recent Labs    01/01/23 0447  CHOL 168  HDL 31*  LDLCALC 107*  TRIG  148  CHOLHDL 5.4   Thyroid Function Tests No results for input(s): "TSH", "T4TOTAL", "T3FREE", "THYROIDAB" in the last 72 hours.  Invalid input(s): "FREET3" _____________  CT Angio Chest Pulmonary Embolism (PE) W or WO Contrast  Result Date: 01/02/2023 CLINICAL DATA:  Elevated troponins, right ventricular dilatation. PE suspected, high probability. EXAM: CT ANGIOGRAPHY CHEST WITH CONTRAST TECHNIQUE: Multidetector CT imaging of the chest was performed using the standard protocol during bolus administration of intravenous contrast. Multiplanar CT image reconstructions and MIPs were obtained to evaluate the vascular anatomy. RADIATION DOSE REDUCTION: This exam was performed according to the departmental dose-optimization program which includes automated exposure control, adjustment of the mA and/or kV according to patient size and/or use of iterative reconstruction technique. CONTRAST:  75mL OMNIPAQUE  IOHEXOL 350 MG/ML SOLN COMPARISON:  None Available. FINDINGS: Cardiovascular: Filling defects are seen in the pulmonary arteries bilaterally, with the most proximal clot seen in the distal right main pulmonary artery. RV/LV ratio 1.0. Atherosclerotic calcification of the aorta and coronary arteries. Heart is enlarged. No pericardial effusion. Mediastinum/Nodes: No pathologically enlarged mediastinal, hilar or axillary lymph nodes. Esophagus is grossly unremarkable. Lungs/Pleura: Image quality is degraded by expiratory phase imaging, creating added density in the lungs, as well as by respiratory motion. Minimal subpleural consolidation in the posterior right lower lobe (6/123). Lungs are otherwise clear. No pleural fluid. Airway is unremarkable. Upper Abdomen: Left renal stones. Severe left renal parenchymal thinning with central low-attenuation, incompletely visualized. Visualized portions of the liver, gallbladder, adrenal glands, kidneys, spleen, pancreas, stomach and bowel are otherwise grossly unremarkable. No upper abdominal adenopathy. Musculoskeletal: Degenerative changes in the spine. Review of the MIP images confirms the above findings. IMPRESSION: 1. Positive for acute PE with CT evidence of right heart strain (RV/LV Ratio = 1.0) consistent with at least submassive (intermediate risk) PE. The presence of right heart strain has been associated with an increased risk of morbidity and mortality. Please refer to the "Code PE Focused" order set in EPIC. Critical Value/emergent results were called by telephone at the time of interpretation on 01/02/2023 at 3:07 pm to provider [facility] , who verbally acknowledged these results. 2. Probable small infarct in the posterior right lower lobe. 3. Left renal stones. 4. Severe left renal parenchymal thinning with central low-attenuation, incompletely visualized. Difficult to exclude hydronephrosis. Consider renal ultrasound in further evaluation, as clinically  indicated. 5. Aortic atherosclerosis (ICD10-I70.0). Coronary artery calcification. Electronically Signed   By: Leanna Battles M.D.   On: 01/02/2023 15:07   ECHOCARDIOGRAM COMPLETE  Result Date: 01/01/2023    ECHOCARDIOGRAM REPORT   Patient Name:   Paul Strickland Date of Exam: 01/01/2023 Medical Rec #:  [id number]       Height:       67.0 in Accession #:    [id number]      Weight:       180.0 lb Date of Birth:  November 21, 1945      BSA:          1.934 m Patient Age:    77 years        BP:           158/90 mmHg Patient Gender: M               HR:           63 bpm. Exam Location:  Inpatient Procedure: 2D Echo, Color Doppler and Cardiac Doppler Indications:    NSTEMI  History:        Patient has  prior history of Echocardiogram examinations, most                 recent 02/05/2012. CAD; Risk Factors:Hypertension, Diabetes,                 Dyslipidemia and Sleep Apnea.  Sonographer:    Irving Burton Senior RDCS Referring Phys: [id number] AMAN D KANSAL IMPRESSIONS  1. Left ventricular ejection fraction, by estimation, is 45 to 50%. The left ventricle has mildly decreased function. Left ventricular endocardial border not optimally defined to evaluate regional wall motion. Left ventricular diastolic parameters are consistent with Grade I diastolic dysfunction (impaired relaxation).  2. Right ventricular systolic function is mildly reduced. The right ventricular size is moderately enlarged. There is normal pulmonary artery systolic pressure. The estimated right ventricular systolic pressure is 21.5 mmHg.  3. The mitral valve is grossly normal. No evidence of mitral valve regurgitation. No evidence of mitral stenosis.  4. The aortic valve is grossly normal. Aortic valve regurgitation is not visualized. No aortic stenosis is present.  5. The inferior vena cava is normal in size with greater than 50% respiratory variability, suggesting right atrial pressure of 3 mmHg. FINDINGS  Left Ventricle: Left ventricular ejection fraction, by  estimation, is 45 to 50%. The left ventricle has mildly decreased function. Left ventricular endocardial border not optimally defined to evaluate regional wall motion. The left ventricular internal cavity size was normal in size. There is no left ventricular hypertrophy. Abnormal (paradoxical) septal motion, consistent with left bundle branch block. Left ventricular diastolic parameters are consistent with Grade I diastolic dysfunction (impaired relaxation). Right Ventricle: The right ventricular size is moderately enlarged. No increase in right ventricular wall thickness. Right ventricular systolic function is mildly reduced. There is normal pulmonary artery systolic pressure. The tricuspid regurgitant velocity is 2.15 m/s, and with an assumed right atrial pressure of 3 mmHg, the estimated right ventricular systolic pressure is 21.5 mmHg. Left Atrium: Left atrial size was normal in size. Right Atrium: Right atrial size was normal in size. Pericardium: There is no evidence of pericardial effusion. Presence of epicardial fat layer. Mitral Valve: The mitral valve is grossly normal. No evidence of mitral valve regurgitation. No evidence of mitral valve stenosis. Tricuspid Valve: The tricuspid valve is grossly normal. Tricuspid valve regurgitation is trivial. No evidence of tricuspid stenosis. Aortic Valve: The aortic valve is grossly normal. Aortic valve regurgitation is not visualized. No aortic stenosis is present. Pulmonic Valve: The pulmonic valve was grossly normal. Pulmonic valve regurgitation is not visualized. No evidence of pulmonic stenosis. Aorta: The aortic root was not well visualized. Venous: The inferior vena cava is normal in size with greater than 50% respiratory variability, suggesting right atrial pressure of 3 mmHg. IAS/Shunts: The atrial septum is grossly normal.  LEFT VENTRICLE PLAX 2D LVIDd:         4.80 cm   Diastology LVIDs:         3.40 cm   LV e' medial:    5.98 cm/s LV PW:         0.70 cm    LV E/e' medial:  7.5 LV IVS:        0.70 cm   LV e' lateral:   5.76 cm/s LVOT diam:     2.30 cm   LV E/e' lateral: 7.7 LV SV:         61 LV SV Index:   32 LVOT Area:     4.15 cm  RIGHT VENTRICLE RV S prime:  7.88 cm/s TAPSE (M-mode): 1.7 cm LEFT ATRIUM             Index        RIGHT ATRIUM           Index LA diam:        3.40 cm 1.76 cm/m   RA Area:     15.40 cm LA Vol (A2C):   50.0 ml 25.86 ml/m  RA Volume:   35.40 ml  18.31 ml/m LA Vol (A4C):   40.2 ml 20.79 ml/m LA Biplane Vol: 48.2 ml 24.93 ml/m  AORTIC VALVE LVOT Vmax:   74.10 cm/s LVOT Vmean:  48.000 cm/s LVOT VTI:    0.148 m  AORTA Ao Root diam: 3.40 cm MV E velocity: 44.60 cm/s  TRICUSPID VALVE MV A velocity: 98.50 cm/s  TR Peak grad:   18.5 mmHg MV E/A ratio:  0.45        TR Vmax:        215.00 cm/s                             SHUNTS                            Systemic VTI:  0.15 m                            Systemic Diam: 2.30 cm Lennie Odor MD Electronically signed by Lennie Odor MD Signature Date/Time: 01/01/2023/9:02:23 AM    Final    DG Chest Port 1 View  Result Date: 12/31/2022 CLINICAL DATA:  Dyspnea, hypertension EXAM: PORTABLE CHEST 1 VIEW COMPARISON:  10/14/2012 FINDINGS: Lungs are well expanded, symmetric, and clear. No pneumothorax or pleural effusion. Cardiac size within normal limits. Pulmonary vascularity is normal. Osseous structures are age-appropriate. No acute bone abnormality. IMPRESSION: No active disease. Electronically Signed   By: Helyn Numbers M.D.   On: 12/31/2022 20:44   Disposition   Pt is being discharged home today in good condition.  Follow-up Plans & Appointments     Follow-up Information     [facility] Follow up.   Specialty: Home Health Services Why: Home health arranged. they will contact you to schedule apt within 48hrs post discharge. Contact information: [address] [place] [address] [phone]                Discharge Instructions     Call  MD for:  difficulty breathing, headache or visual disturbances   Complete by: As directed    Diet - low sodium heart healthy   Complete by: As directed    Face-to-face encounter (required for Medicare/Medicaid patients)   Complete by: As directed    I Laverda Page certify that this patient is under my care and that I, or a nurse practitioner or physician's assistant working with me, had a face-to-face encounter that meets the physician face-to-face encounter requirements with this patient on 01/03/2023. The encounter with the patient was in whole, or in part for the following medical condition(s) which is the primary reason for home health care (List medical condition): Deconditioned, dementia, PE   The encounter with the patient was in whole, or in part, for the following medical condition, which is the primary reason for home health care: Deconditioned, dementia, PE   I certify that, based on my  findings, the following services are medically necessary home health services: Nursing   Reason for Medically Necessary Home Health Services: Other See Comments   My clinical findings support the need for the above services: OTHER SEE COMMENTS   Further, I certify that my clinical findings support that this patient is homebound due to: Ambulates short distances less than 300 feet   Home Health   Complete by: As directed    To provide the following care/treatments:  PT OT     Increase activity slowly   Complete by: As directed         Discharge Medications   Allergies as of 01/03/2023       Reactions   Penicillins Rash        Medication List     STOP taking these medications    clopidogrel 75 MG tablet Commonly known as: Plavix   isosorbide mononitrate 30 MG 24 hr tablet Commonly known as: IMDUR       TAKE these medications    amLODipine 5 MG tablet Commonly known as: NORVASC Take 1 tablet (5 mg total) by mouth daily. Start taking on: January 04, 2023   aspirin EC 81  MG tablet Take 81 mg by mouth daily. Swallow whole.   atorvastatin 80 MG tablet Commonly known as: Lipitor Take 1 tablet (80 mg total) by mouth daily.   Rivaroxaban Starter Pack (15 mg and 20 mg) Commonly known as: XARELTO STARTER PACK Follow package directions: Take one 15mg  tablet by mouth twice a day. On day 22, switch to one 20mg  tablet once a day. Take with food.   rivastigmine 13.3 MG/24HR Commonly known as: EXELON Place 1 patch onto the skin daily. exelon patch for memory         Outstanding Labs/Studies   LE doppler results   Duration of Discharge Encounter   Greater than 30 minutes including physician time.  Signed, Laverda Page, NP 01/03/2023, 2:12 PM  After conducting a review of all available clinical information with the care team, interviewing the patient, and performing a physical exam, I agree with the findings and plan described in this note.   77 y.o. male with hypertension, hyperlipidemia, CAD (PCI to OM in 2014), dementia, admitted with chest pain.  Initially thought to be non-STEMI, manage medically due to patient's advanced age and dementia.  However, echocardiogram showed RV dilatation, that led to CT angiogram that showed bilateral PE with RV strain.  He is not a candidate for any invasive management for the same reason of dementia.  Started on anticoagulation with Xarelto.  Stop Plavix.  Hemodynamically stable.  DVT study pending.  I suspect immobility provoked his PE with possibility of DVT.  Treatment remains the same, duration may defer depending on location of DVT, if present..  Will follow-up outpatient.  Physical Exam Vitals and nursing note reviewed.  Constitutional:      General: He is not in acute distress. Neck:     Vascular: No JVD.  Cardiovascular:     Rate and Rhythm: Normal rate and regular rhythm.     Heart sounds: Normal heart sounds. No murmur heard. Pulmonary:     Effort: Pulmonary effort is normal.     Breath sounds: Normal  breath sounds. No wheezing or rales.  Musculoskeletal:     Right lower leg: No edema.     Left lower leg: No edema.      Truett Mainland, MD

## 2023-01-03 NOTE — Plan of Care (Signed)

## 2023-01-03 NOTE — Evaluation (Signed)
Physical Therapy Evaluation Patient Details Name: Paul Strickland MRN: [id number] DOB: 1945-12-07 Today's Date: 01/03/2023  History of Present Illness  Pt is 77 yo male admitted 11/04 from [facility] ED for chest pain determined NSTEMI, Acute PE 11/06. PMH: HTN, HLD, CAD (PCI to OM in 2014), Alzheimer's dementia  Clinical Impression  PT pleasantly confused, who normally walks without AD per wife with slow shuffling gait and has assist for ADLs. Pt lives with wife with sons nearby who can assist as needed at D/C. Due to pt cognitive deficits spouse would prefer to return home with HHPT rather than SNF. Pt with decreased strength, transfers, function and limited by Rt knee pain. Pt will benefit from acute therapy to maximize mobility, safety and independence.         If plan is discharge home, recommend the following: Supervision due to cognitive status;A lot of help with bathing/dressing/bathroom;Two people to help with walking and/or transfers;Direct supervision/assist for financial management;Assist for transportation;Direct supervision/assist for medications management   Can travel by private vehicle        Equipment Recommendations BSC/3in1;Wheelchair (measurements PT)  Recommendations for Other Services       Functional Status Assessment Patient has had a recent decline in their functional status and/or demonstrates limited ability to make significant improvements in function in a reasonable and predictable amount of time     Precautions / Restrictions Precautions Precautions: Fall      Mobility  Bed Mobility Overal bed mobility: Needs Assistance Bed Mobility: Supine to Sit     Supine to sit: Min assist, HOB elevated     General bed mobility comments: HOB 20 degrees min assist to elevate trunk, mod cues to scoot to EOB and stabilize trunk    Transfers Overall transfer level: Needs assistance   Transfers: Bed to chair/wheelchair/BSC   Stand pivot transfers: Mod  assist, +2 safety/equipment         General transfer comment: CGA for initial stand from elevated bed surface, with limited ability to tolerate stance on Rt knee, pt took partial steps forward with chair pulled to him. mod assist to stand from chair to RW. With use of stedy min assist to stand from chair to stedy and CGA to stand from stedy pads total of 5 standing trials. cues for hand placement and safety    Ambulation/Gait                  Stairs            Wheelchair Mobility     Tilt Bed    Modified Rankin (Stroke Patients Only)       Balance Overall balance assessment: Needs assistance   Sitting balance-Leahy Scale: Fair     Standing balance support: Bilateral upper extremity supported, During functional activity, Reliant on assistive device for balance Standing balance-Leahy Scale: Poor Standing balance comment: pt able to rise, immediately needing UB support. difficulty understanding use of RW but not significantly improved with bil HHA                             Pertinent Vitals/Pain Pain Assessment Pain Assessment: 0-10 Pain Score: 5  Pain Location: Rt knee Pain Descriptors / Indicators: Aching, Guarding Pain Intervention(s): Limited activity within patient's tolerance, Monitored during session, Repositioned    Home Living Family/patient expects to be discharged to:: Private residence Living Arrangements: Spouse/significant other Available Help at Discharge: Available 24 hours/day Type of Home:  House Home Access: Stairs to enter   [employer] of Steps: 2   Home Layout: Two level;Able to live on main level with bedroom/bathroom Home Equipment: Shower seat;Rolling Walker (2 wheels);Cane - single point      Prior Function               Mobility Comments: walks slowly without AD ADLs Comments: wife assists with ADLs, wife does the IADLs and driving     Extremity/Trunk Assessment   Upper Extremity  Assessment Upper Extremity Assessment: Generalized weakness    Lower Extremity Assessment Lower Extremity Assessment: Generalized weakness    Cervical / Trunk Assessment Cervical / Trunk Assessment: Kyphotic  Communication   Communication Communication: No apparent difficulties  Cognition Arousal: Alert Behavior During Therapy: WFL for tasks assessed/performed Overall Cognitive Status: History of cognitive impairments - at baseline                                          General Comments      Exercises General Exercises - Lower Extremity Long Arc Quad: AAROM, Both, 10 reps, Seated   Assessment/Plan    PT Assessment Patient needs continued PT services  PT Problem List Decreased strength;Decreased cognition;Decreased activity tolerance;Decreased range of motion;Decreased knowledge of use of DME;Decreased balance;Pain;Decreased safety awareness;Decreased mobility;Decreased coordination       PT Treatment Interventions DME instruction;Gait training;Functional mobility training;Therapeutic activities;Therapeutic exercise;Balance training;Patient/family education    PT Goals (Current goals can be found in the Care Plan section)  Acute Rehab PT Goals Patient Stated Goal: spouse requests return home due to cognitive deficits, return to walking PT Goal Formulation: With patient/family Time For Goal Achievement: 01/17/23 Potential to Achieve Goals: Fair    Frequency Min 1X/week     Co-evaluation               AM-PAC PT "6 Clicks" Mobility  Outcome Measure Help needed turning from your back to your side while in a flat bed without using bedrails?: A Little Help needed moving from lying on your back to sitting on the side of a flat bed without using bedrails?: A Little Help needed moving to and from a bed to a chair (including a wheelchair)?: A Lot Help needed standing up from a chair using your arms (e.g., wheelchair or bedside chair)?: A Lot Help  needed to walk in hospital room?: Total Help needed climbing 3-5 steps with a railing? : Total 6 Click Score: 12    End of Session Equipment Utilized During Treatment: Gait belt Activity Tolerance: Patient limited by pain Patient left: in chair;with call bell/phone within reach;with chair alarm set;with family/visitor present Nurse Communication: Mobility status PT Visit Diagnosis: Other abnormalities of gait and mobility (R26.89);Difficulty in walking, not elsewhere classified (R26.2);Pain;Muscle weakness (generalized) (M62.81)    Time: 8469-6295 PT Time Calculation (min) (ACUTE ONLY): 32 min   Charges:   PT Evaluation $PT Eval Moderate Complexity: 1 Mod PT Treatments $Therapeutic Activity: 8-22 mins PT General Charges $$ ACUTE PT VISIT: 1 Visit         Merryl Hacker, PT Acute Rehabilitation Services Office: [phone]   Enedina Finner Taraneh Metheney 01/03/2023, 10:27 AM

## 2023-01-03 NOTE — Progress Notes (Signed)
    Informed by RN that patient family is requesting transport home, will need PTAR. Given time of day, unclear if able to arrange for transport back to [place] at reasonable time. Will cancel DC for today and plan for tomorrow.   Janice Coffin, NP-C 01/03/2023, 5:05 PM Pager: [phone]

## 2023-01-03 NOTE — Plan of Care (Signed)
  Problem: Activity: Goal: Ability to tolerate increased activity will improve Outcome: Progressing   Problem: Cardiac: Goal: Ability to achieve and maintain adequate cardiovascular perfusion will improve Outcome: Progressing   Problem: Clinical Measurements: Goal: Will remain free from infection Outcome: Progressing Goal: Respiratory complications will improve Outcome: Progressing   Problem: Pain Management: Goal: General experience of comfort will improve Outcome: Progressing

## 2023-01-03 NOTE — Progress Notes (Addendum)
Patient provided with verbal discharge instructions. Paper copy of discharge provided to patient. Son at beside during d/c. RN answered all questions. VSS at discharge. IV removed. Patient belongings sent with patient. D/c delayed due to needing PTAR. Awaiting insurance approval. Patient dc'd via PTAR to home 11/8.

## 2023-01-03 NOTE — TOC Initial Note (Signed)
Transition of Care [facility]) - Initial/Assessment Note    Patient Details  Name: Paul Strickland MRN: [id number] Date of Birth: [birth year]  Transition [facility] Care [facility]) CM/SW Contact:    Harriet Masson, RN Phone Number: 01/03/2023, 12:51 PM  Clinical Narrative:                 Spoke with wife at bedside regarding transition needs.  Patient lives with wife and has 2 sons nearby. Wife is agreeable to home health. Choice offered and wife defers to this RNCM to find highly rated agency. Kandee Keen with [facility] accepted referral. Address, Phone number and PCP verified. TOC following.      Patient Scientific laboratory technician completed.     The patient is insured through [employer]/ [employer]. Patient has Medicare and is not eligible for a copay card, but may be able to apply for patient assistance, if available.     Ran test claim for Eliquis 5 mg and the current 30 day co-pay is $47.00.   Ran test claim for Xarelto 20 mg and the current 30 day co-pay is $47.00.   This test claim was processed through [facility]- copay amounts may vary at other pharmacies due to pharmacy/plan contracts, or as the patient moves through the different stages of their insurance plan.   Expected Discharge Plan: Home w Home Health Services Barriers to Discharge: Continued Medical Work up   Patient Goals and CMS Choice Patient states their goals for this hospitalization and ongoing recovery are:: wife wants patient to return home CMS Medicare.gov Compare Post Acute Care list provided to:: Patient Represenative (must comment) Choice offered to / list presented to : Spouse      Expected Discharge Plan and Services       Living arrangements for the past 2 months: Single Family Home                           HH Arranged: PT, OT HH Agency: [facility] Date [facility] Agency Contacted: 01/03/23 Time HH Agency Contacted: 1251 Representative spoke with at  [facility] Agency: Kandee Keen  Prior Living Arrangements/Services Living arrangements for the past 2 months: Single Family Home Lives with:: Spouse Patient language and need for interpreter reviewed:: Yes Do you feel safe going back to the place where you live?: Yes      Need for Family Participation in Patient Care: Yes (Comment) Care giver support system in place?: Yes (comment) Current home services: DME (walker) Criminal Activity/Legal Involvement Pertinent to Current Situation/Hospitalization: No - Comment as needed  Activities of Daily Living   ADL Screening (condition at time of admission) Independently performs ADLs?: No Does the patient have a NEW difficulty with bathing/dressing/toileting/self-feeding that is expected to last >3 days?: No Does the patient have a NEW difficulty with getting in/out of bed, walking, or climbing stairs that is expected to last >3 days?: No Does the patient have a NEW difficulty with communication that is expected to last >3 days?: No Is the patient deaf or have difficulty hearing?: No Does the patient have difficulty seeing, even when wearing glasses/contacts?: No Does the patient have difficulty concentrating, remembering, or making decisions?: Yes  Permission Sought/Granted                  Emotional Assessment         Alcohol / Substance Use: Not Applicable Psych Involvement: No (comment)  Admission  diagnosis:  NSTEMI (non-ST elevated myocardial infarction) [facility]) [I21.4] Patient Active Problem List   Diagnosis Date Noted   Pulmonary embolism with acute cor pulmonale (HCC) 01/02/2023   Moderate Alzheimer's dementia without behavioral disturbance, psychotic disturbance, mood disturbance, or anxiety (HCC) 01/01/2023   Unstable angina (HCC) 02/05/2012   Coronary atherosclerosis of native coronary artery 02/05/2012   Acute renal insufficiency 02/05/2012   HTN (hypertension)    Diabetes mellitus type 2, diet-controlled (HCC)    Mixed  dyslipidemia    PCP:  Richardean Chimera, MD Pharmacy:   [facility] - [place], [place] - [address] [place] [address] Phone: [phone] Fax: [phone]  [facility] [address] [place] [address] Phone: [phone] Fax: [phone]     Social Determinants of Health (SDOH) Social History: SDOH Screenings   Food Insecurity: No Food Insecurity (01/02/2023)  Housing: Low Risk  (01/02/2023)  Transportation Needs: No Transportation Needs (01/02/2023)  Utilities: Not At Risk (01/01/2023)  Tobacco Use: Medium Risk (01/02/2023)   SDOH Interventions:     Readmission Risk Interventions     No data to display

## 2023-01-03 NOTE — Evaluation (Signed)
Occupational Therapy Evaluation Patient Details Name: Paul Strickland MRN: [id number] DOB: 05-20-1945 Today's Date: 01/03/2023   History of Present Illness Pt is 77 yo male admitted 11/04 from [facility] ED for chest pain determined NSTEMI, Acute PE 11/06. PMH: HTN, HLD, CAD (PCI to OM in 2014), Alzheimer's dementia   Clinical Impression   At baseline, pt requires Supervision to Mod assistance for ADLs and IADLs and performs functional mobility in the home Mod I without an AD. Pt now presents with decreased activity tolerance, B UE generalized weakness, decreased B UE coordination, R knee pain affecting functional level, and decreased safety and independence with functional tasks. Pt currently demonstrates ability to complete UB ADLs with Supervision to Mod assist and LB ADLs with Mod +2 to Total assist +2 for safety. Pt will benefit from acute skilled OT services to address deficits outlined below, decrease caregiver burden, and increase safety and independence with functional tasks. Post acute discharge, pt will benefit from continued skilled occupational therapy services in the home to maximize rehab potential and for additional family education.       If plan is discharge home, recommend the following: Two people to help with walking and/or transfers;Two people to help with bathing/dressing/bathroom;Assistance with cooking/housework;Assistance with feeding;Direct supervision/assist for medications management;Direct supervision/assist for financial management;Assist for transportation;Help with stairs or ramp for entrance;Supervision due to cognitive status    Functional Status Assessment  Patient has had a recent decline in their functional status and demonstrates the ability to make significant improvements in function in a reasonable and predictable amount of time.  Equipment Recommendations  None recommended by OT (Pt already has needed equipment.)    Recommendations for Other Services        Precautions / Restrictions Precautions Precautions: Fall      Mobility Bed Mobility Overal bed mobility: Needs Assistance             General bed mobility comments: Pt sitting in recliner at begining and end of session.    Transfers Overall transfer level: Needs assistance                 General transfer comment: Pt declined this session due to knee pain. During PT eval earlier this day, pt performed transfer from bed to recliner with use of Stedy with Mod assist +2 for safety.      Balance Overall balance assessment: Needs assistance Sitting-balance support: No upper extremity supported, Single extremity supported, Feet supported Sitting balance-Leahy Scale: Fair                                     ADL either performed or assessed with clinical judgement   ADL Overall ADL's : Needs assistance/impaired Eating/Feeding: Set up;Sitting (cues for initiation; occasional cues for locating items)   Grooming: Minimal assistance;Cueing for sequencing;Sitting   Upper Body Bathing: Moderate assistance;Cueing for sequencing;Sitting   Lower Body Bathing: Maximal assistance;Cueing for safety;Cueing for sequencing;Sit to/from stand;Sitting/lateral leans;+2 for safety/equipment   Upper Body Dressing : Moderate assistance;Cueing for sequencing;Sitting   Lower Body Dressing: Moderate assistance;Maximal assistance;Cueing for safety;Cueing for sequencing;Sitting/lateral leans;Sit to/from stand;+2 for safety/equipment   Toilet Transfer: Moderate assistance;BSC/3in1;Stand-pivot;Cueing for safety;Cueing for sequencing;+2 for safety/equipment (with use of Stedy)   Toileting- Clothing Manipulation and Hygiene: Total assistance;Sit to/from stand;+2 for safety/equipment         General ADL Comments: Pt with decreased activity tolerance during functional tasks this session.  Vision Baseline Vision/History: 0 No visual deficits Patient Visual Report: No  change from baseline Additional Comments: Per wife's report, pt sometimes has difficulty locating plate, cup, and utensils during meals and occasionally appears to not know where he is in space in the environment. Last eye exam approx. a year ago was WNL. Vision changes suspected to be related to sensroy changes related to dementia.     Perception         Praxis         Pertinent Vitals/Pain Pain Assessment Pain Assessment: Faces Faces Pain Scale: Hurts even more Pain Location: R knee Pain Descriptors / Indicators: Guarding, Grimacing, Discomfort Pain Intervention(s): Limited activity within patient's tolerance, Monitored during session, Repositioned, Patient requesting pain meds-RN notified     Extremity/Trunk Assessment Upper Extremity Assessment Upper Extremity Assessment: Right hand dominant;Generalized weakness;RUE deficits/detail;LUE deficits/detail RUE Deficits / Details: generalized weakness; decreased coordination; hx shoulder injury with subsequent mildly decreased shoulder AROM at baseline RUE Coordination: decreased fine motor;decreased gross motor LUE Deficits / Details: generalized weakness, decreased coordination LUE Coordination: decreased fine motor;decreased gross motor   Lower Extremity Assessment Lower Extremity Assessment: Defer to PT evaluation   Cervical / Trunk Assessment Cervical / Trunk Assessment: Kyphotic   Communication Communication Communication: Difficulty communicating thoughts/reduced clarity of speech (Largely WFL with increased time for processing and word finding; pt's wife reports pt's communication is largely at baseline but that he is often more talkative) Cueing Techniques: Verbal cues;Gestural cues;Tactile cues;Visual cues   Cognition Arousal: Alert Behavior During Therapy: WFL for tasks assessed/performed Overall Cognitive Status: History of cognitive impairments - at baseline                                        General Comments  VSS on RA throughout session. Pt's wife present throughout session and providing history and PLOF.    Exercises     Shoulder Instructions      Home Living Family/patient expects to be discharged to:: Private residence Living Arrangements: Spouse/significant other Available Help at Discharge: Available 24 hours/day Type of Home: House Home Access: Stairs to enter [employer] of Steps: 2 Entrance Stairs-Rails: Right;Left;Can reach both Home Layout: Two level;Able to live on main level with bedroom/bathroom Alternate Level Stairs-Number of Steps: flight (pt stays on first floor)   Bathroom Shower/Tub: Walk-in shower   Bathroom Toilet: Handicapped height Bathroom Accessibility: Yes How Accessible: Accessible via walker Home Equipment: Pharmacist, hospital (2 wheels);Cane - single point;Grab bars - tub/shower;Wheelchair - manual   Additional Comments: Pt's wife reports she plans to get a [facility] from a friend who is not using it.      Prior Functioning/Environment Prior Level of Function : Needs assist             Mobility Comments: Mod I with functional mobility with increased time and slow gait ADLs Comments: Pt requires assist from wife for ADLs and IADLs. Pt's wife reports pt's level of assist with ADLs fluctuates day to day from Supervision with cues for sequencing to Mod assist. Wife and sons also assists with transportaiton.        OT Problem List: Decreased strength;Decreased activity tolerance;Impaired balance (sitting and/or standing);Decreased coordination;Impaired UE functional use;Pain      OT Treatment/Interventions: Self-care/ADL training;Therapeutic exercise;DME and/or AE instruction;Therapeutic activities;Patient/family education    OT Goals(Current goals can be found in the care plan section) Acute Rehab OT Goals  Patient Stated Goal: Pt unable to state OT Goal Formulation: With family Time For Goal Achievement:  01/17/23 Potential to Achieve Goals: Fair ADL Goals Pt Will Perform Grooming: with supervision;sitting (with cues as needed) Pt Will Perform Upper Body Bathing: with contact guard assist;sitting (with cues as needed) Pt Will Perform Lower Body Bathing: sit to/from stand;sitting/lateral leans;with mod assist (with cues as needed) Pt Will Perform Upper Body Dressing: with contact guard assist;sitting (with cues as needed) Pt Will Transfer to Toilet: with min assist;ambulating;bedside commode (with cues as needed; with least restrictive AD) Pt Will Perform Toileting - Clothing Manipulation and hygiene: with mod assist;sitting/lateral leans;sit to/from stand  OT Frequency: Min 1X/week    Co-evaluation              AM-PAC OT "6 Clicks" Daily Activity     Outcome Measure Help from another person eating meals?: A Little Help from another person taking care of personal grooming?: A Little Help from another person toileting, which includes using toliet, bedpan, or urinal?: Total Help from another person bathing (including washing, rinsing, drying)?: A Lot Help from another person to put on and taking off regular upper body clothing?: A Lot Help from another person to put on and taking off regular lower body clothing?: A Lot 6 Click Score: 13   End of Session Nurse Communication: Mobility status;Patient requests pain meds  Activity Tolerance: Patient tolerated treatment well;Other (comment) (Pt limited by cognition.) Patient left: in chair;with call bell/phone within reach;with chair alarm set;with family/visitor present  OT Visit Diagnosis: Other abnormalities of gait and mobility (R26.89);Muscle weakness (generalized) (M62.81);Ataxia, unspecified (R27.0);Other (comment);Pain (decreased activity tolerance)                Time: 2841-3244 OT Time Calculation (min): 30 min Charges:  OT General Charges $OT Visit: 1 Visit OT Evaluation $OT Eval Low Complexity: 1 Low OT Treatments $Self  Care/Home Management : 8-22 mins  Jasey Cortez "Orson Eva., OTR/L, MA Acute Rehab [phone]   Lendon Colonel 01/03/2023, 12:27 PM

## 2023-01-03 NOTE — TOC Progression Note (Signed)
Transition of Care [facility]) - Progression Note    Patient Details  Name: Paul Strickland MRN: [id number] Date of Birth: October 04, 1945  [facility]) CM/SW Contact  Harriet Masson, RN Phone Number: 01/03/2023, 4:52 PM  Clinical Narrative:    Left VM with HTA, Anice Paganini, to get PTAR transport approval for discharge home.  Awaiting for return call.   MD aware.   Expected Discharge Plan: Home w Home Health Services Barriers to Discharge: Continued Medical Work up  Expected Discharge Plan and Services       Living arrangements for the past 2 months: Single Family Home Expected Discharge Date: 01/03/23                         HH Arranged: PT, OT HH Agency: [facility] Date [facility] Agency Contacted: 01/03/23 Time HH Agency Contacted: 1251 Representative spoke with at [facility] Agency: Kandee Keen   Social Determinants of Health (SDOH) Interventions SDOH Screenings   Food Insecurity: No Food Insecurity (01/02/2023)  Housing: Low Risk  (01/02/2023)  Transportation Needs: No Transportation Needs (01/02/2023)  Utilities: Not At Risk (01/01/2023)  Tobacco Use: Medium Risk (01/02/2023)    Readmission Risk Interventions    01/03/2023    4:42 PM  Readmission Risk Prevention Plan  Post Dischage Appt Complete  Medication Screening Complete  Transportation Screening Complete

## 2023-01-04 LAB — BASIC METABOLIC PANEL
Anion gap: 7 (ref 5–15)
BUN: 25 mg/dL — ABNORMAL HIGH (ref 8–23)
CO2: 22 mmol/L (ref 22–32)
Calcium: 8.4 mg/dL — ABNORMAL LOW (ref 8.9–10.3)
Chloride: 105 mmol/L (ref 98–111)
Creatinine, Ser: 1.77 mg/dL — ABNORMAL HIGH (ref 0.61–1.24)
GFR, Estimated: 39 mL/min — ABNORMAL LOW (ref >60)
Glucose, Bld: 133 mg/dL — ABNORMAL HIGH (ref 70–99)
Potassium: 4.1 mmol/L (ref 3.5–5.1)
Sodium: 134 mmol/L — ABNORMAL LOW (ref 135–145)

## 2023-01-04 NOTE — TOC Progression Note (Signed)
Transition of Care [facility]) - Progression Note    Patient Details  Name: Paul Strickland MRN: [id number] Date of Birth: 1946/02/24  Transition of Care [facility]) CM/SW Contact  Janae Bridgeman, RN Phone Number: 01/04/2023, 10:02 AM  Clinical Narrative:    CM called and left a voicemail message with Junious Dresser, [facility] with [employer] requested follow up/coverage for needed PTAR to home.  CM will follow up and arrange transportation once I am able to reach the insurance company provider.   Expected Discharge Plan: Home w Home Health Services Barriers to Discharge: Continued Medical Work up  Expected Discharge Plan and Services       Living arrangements for the past 2 months: Single Family Home Expected Discharge Date: 01/03/23                         HH Arranged: PT, OT HH Agency: [facility] Date [facility] Agency Contacted: 01/03/23 Time HH Agency Contacted: [id number] Representative spoke with at [facility] Agency: Kandee Keen   Social Determinants of Health (SDOH) Interventions SDOH Screenings   Food Insecurity: No Food Insecurity (01/02/2023)  Housing: Low Risk  (01/02/2023)  Transportation Needs: No Transportation Needs (01/02/2023)  Utilities: Not At Risk (01/01/2023)  Tobacco Use: Medium Risk (01/02/2023)    Readmission Risk Interventions    01/03/2023    4:42 PM  Readmission Risk Prevention Plan  Post Dischage Appt Complete  Medication Screening Complete  Transportation Screening Complete

## 2023-01-04 NOTE — Discharge Summary (Signed)
Discharge Summary    Patient ID: Paul Strickland MRN: [id number]; DOB: [birth year]  Admit date: 12/31/2022 Discharge date: 01/04/2023  PCP:  Richardean Chimera, MD   [facility] Providers Cardiologist:  Elder Negus, MD     Discharge Diagnoses    Principal Problem:   Pulmonary embolism with acute cor pulmonale (HCC) Active Problems:   HTN (hypertension)   Mixed dyslipidemia   Moderate Alzheimer's dementia without behavioral disturbance, psychotic disturbance, mood disturbance, or anxiety (HCC)    Diagnostic Studies/Procedures    Echo: 01/01/2023   IMPRESSIONS     1. Left ventricular ejection fraction, by estimation, is 45 to 50%. The  left ventricle has mildly decreased function. Left ventricular endocardial  border not optimally defined to evaluate regional wall motion. Left  ventricular diastolic parameters are  consistent with Grade I diastolic dysfunction (impaired relaxation).   2. Right ventricular systolic function is mildly reduced. The right  ventricular size is moderately enlarged. There is normal pulmonary artery  systolic pressure. The estimated right ventricular systolic pressure is  21.5 mmHg.   3. The mitral valve is grossly normal. No evidence of mitral valve  regurgitation. No evidence of mitral stenosis.   4. The aortic valve is grossly normal. Aortic valve regurgitation is not  visualized. No aortic stenosis is present.   5. The inferior vena cava is normal in size with greater than 50%  respiratory variability, suggesting right atrial pressure of 3 mmHg.   FINDINGS   Left Ventricle: Left ventricular ejection fraction, by estimation, is 45  to 50%. The left ventricle has mildly decreased function. Left ventricular  endocardial border not optimally defined to evaluate regional wall motion.  The left ventricular internal  cavity size was normal in size. There is no left ventricular hypertrophy.  Abnormal (paradoxical) septal motion,  consistent with left bundle branch  block. Left ventricular diastolic parameters are consistent with Grade I  diastolic dysfunction (impaired  relaxation).   Right Ventricle: The right ventricular size is moderately enlarged. No  increase in right ventricular wall thickness. Right ventricular systolic  function is mildly reduced. There is normal pulmonary artery systolic  pressure. The tricuspid regurgitant  velocity is 2.15 m/s, and with an assumed right atrial pressure of 3 mmHg,  the estimated right ventricular systolic pressure is 21.5 mmHg.   Left Atrium: Left atrial size was normal in size.   Right Atrium: Right atrial size was normal in size.   Pericardium: There is no evidence of pericardial effusion. Presence of  epicardial fat layer.   Mitral Valve: The mitral valve is grossly normal. No evidence of mitral  valve regurgitation. No evidence of mitral valve stenosis.   Tricuspid Valve: The tricuspid valve is grossly normal. Tricuspid valve  regurgitation is trivial. No evidence of tricuspid stenosis.   Aortic Valve: The aortic valve is grossly normal. Aortic valve  regurgitation is not visualized. No aortic stenosis is present.   Pulmonic Valve: The pulmonic valve was grossly normal. Pulmonic valve  regurgitation is not visualized. No evidence of pulmonic stenosis.   Aorta: The aortic root was not Strickland visualized.   Venous: The inferior vena cava is normal in size with greater than 50%  respiratory variability, suggesting right atrial pressure of 3 mmHg.   IAS/Shunts: The atrial septum is grossly normal.  _____________   History of Present Illness     Paul Strickland is a 77 y.o. male with a hx of CAD s/p DES to OM in  2014, HTN, HL, dementia who was seen 12/31/2022 for the evaluation of NSTEMI at the request of [name]. He had intermittent chest pain the day prior to admission followed by persistent pain lasting an hour with shortness of breath today. Located  left side of chest, non radiating, self resolved. He had DES x 1 to OM placed in 09/2012 in the setting of an NSTEMI. Last TTE 01/2012 showed mildly reduced EF, otherwise unremarkable.   In the ED, he was hypertensive to 150/100s. Labs notable for Cr 1.7 from normal baseline, WBC 14.4, troponin 300 -> 401, EKG in NSR with RBBB, without ischemic changes. He was given ASA load, continued on home plavix, and started on heparin. Admitted to cardiology for further management.   Hospital Course     Chest pain  NSTEMI Early submassive PE/probable small infarct in posterior RLL Left DVT  -- presented with chest pain and found to have elevated troponin 300>>401. After MD discussion with the patients wife, decision was made to treat medically with no plan for ischemic evaluation -- echo showed LVEF of 45-50%, g1DD, moderately RV enlargement  -- underwent CT angio with acute PE, evidence of right heart strain. 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   Hypertension -- Continue amlodipine 5 mg daily -- Imdur/metoprolol stopped    Dementia Deconditioned -- Known advanced Alzheimer's dementia. -- On rivastigmine patch at home  -- seen by PT/OT with recs for home health, ordered placed at discharge   Patient was medically stable for discharge on 11/7. However, at around 5 PM we were made aware that he did not have transportation home. Given time of day, it was unlikely that we would be able to arrange for transport back to [place]. DC was canceled and patient  was kept overnight. There were no medical changes from 11/7 into 11/8.    Patient was seen by [name] and deemed stable for discharge home. Follow up arranged in the office on 11/19. Medications sent to [facility] pharmacy    Discharge Vitals Blood pressure 113/66, pulse 66, temperature 98.9 F (37.2 C), temperature source Oral, resp. rate 16, height 5\' 7"  (1.702 m), weight 81.6 kg, SpO2 98%.  Filed Weights   12/31/22 2139  Weight: 81.6 kg    Labs & Radiologic Studies    CBC Recent Labs    01/02/23 0233 01/03/23 0236  WBC 18.6* 16.1*  HGB 13.8 12.9*  HCT 41.5 38.6*  MCV 85.2 84.5  PLT 280 278   Basic Metabolic Panel Recent Labs    16/10/96 0236 01/04/23 0651  NA 133* 134*  K 3.8 4.1  CL 99 105  CO2 23 22  GLUCOSE 135* 133*  BUN 17 25*  CREATININE 1.52* 1.77*  CALCIUM 8.6* 8.4*   Liver Function Tests No results for input(s): "AST", "ALT", "ALKPHOS", "BILITOT", "PROT", "ALBUMIN" in the last 72 hours. No results for input(s): "LIPASE", "AMYLASE" in the last 72 hours. High Sensitivity Troponin:  Recent Labs  Lab 12/31/22 2007 12/31/22 2155  TROPONINIHS 300* 401*    BNP Invalid input(s): "POCBNP" D-Dimer No results for input(s): "DDIMER" in the last 72 hours. Hemoglobin A1C No results for input(s): "HGBA1C" in the last 72 hours. Fasting Lipid Panel No results for input(s): "CHOL", "HDL", "LDLCALC", "TRIG", "CHOLHDL", "LDLDIRECT" in the last 72 hours. Thyroid Function Tests No results for input(s): "TSH", "T4TOTAL", "T3FREE", "THYROIDAB" in the last 72 hours.  Invalid input(s): "FREET3" _____________  VAS [place] LOWER EXTREMITY VENOUS (DVT)  Result Date: 01/03/2023  Lower Venous DVT Study Patient Name:  Paul Strickland  Date of Exam:   01/03/2023 Medical Rec #: [id number]        Accession #:    [id number] Date of Birth: 06-05-1945       Patient Gender: M Patient Age:   23 years Exam Location:  [facility] Procedure:      VAS [place] LOWER EXTREMITY VENOUS  (DVT) Referring Phys: [facility] PATWARDHAN --------------------------------------------------------------------------------  Indications: Pulmonary embolism, SOB, and chest pain.  Risk Factors: Confirmed PE. Comparison Study: No prior exam. Performing Technologist: Fernande Bras  Examination Guidelines: A complete evaluation includes B-mode imaging, spectral Doppler, color Doppler, and power Doppler as needed of all accessible portions of each vessel. Bilateral testing is considered an integral part of a complete examination. Limited examinations for reoccurring indications may be performed as noted. The reflux portion of the exam is performed with the patient in reverse Trendelenburg.  +---------+---------------+---------+-----------+----------+--------------+ RIGHT    CompressibilityPhasicitySpontaneityPropertiesThrombus Aging +---------+---------------+---------+-----------+----------+--------------+ CFV      Full           Yes      Yes                                 +---------+---------------+---------+-----------+----------+--------------+ SFJ      Full                                                        +---------+---------------+---------+-----------+----------+--------------+ FV Prox  Full                                                        +---------+---------------+---------+-----------+----------+--------------+ FV Mid   Full                                                        +---------+---------------+---------+-----------+----------+--------------+ FV DistalFull                                                        +---------+---------------+---------+-----------+----------+--------------+ PFV      Full                                                        +---------+---------------+---------+-----------+----------+--------------+  POP      Full           Yes      Yes                                  +---------+---------------+---------+-----------+----------+--------------+ PTV      Full           Yes                                          +---------+---------------+---------+-----------+----------+--------------+ PERO     Full                                                        +---------+---------------+---------+-----------+----------+--------------+ Limited views of right popliteal due to patient stiffening up legs. Slightly combative due to dementia  +---------+---------------+---------+-----------+----------+--------------+ LEFT     CompressibilityPhasicitySpontaneityPropertiesThrombus Aging +---------+---------------+---------+-----------+----------+--------------+ CFV      Full           Yes      Yes                                 +---------+---------------+---------+-----------+----------+--------------+ SFJ      Full                                                        +---------+---------------+---------+-----------+----------+--------------+ FV Prox  Full                                                        +---------+---------------+---------+-----------+----------+--------------+ FV Mid   Full                                                        +---------+---------------+---------+-----------+----------+--------------+ FV DistalFull                                                        +---------+---------------+---------+-----------+----------+--------------+ PFV      Full                                                        +---------+---------------+---------+-----------+----------+--------------+ POP      Full           Yes      Yes                                 +---------+---------------+---------+-----------+----------+--------------+  PTV      Full                                                        +---------+---------------+---------+-----------+----------+--------------+ PERO     None            No       No                                  +---------+---------------+---------+-----------+----------+--------------+     Summary: RIGHT: - There is no evidence of deep vein thrombosis in the lower extremity.  - No cystic structure found in the popliteal fossa.  LEFT: - Findings consistent with acute deep vein thrombosis involving the left peroneal veins.  - No cystic structure found in the popliteal fossa.  *See table(s) above for measurements and observations. Electronically signed by Heath Lark on 01/03/2023 at 6:08:27 PM.    Final    CT Angio Chest Pulmonary Embolism (PE) W or WO Contrast  Result Date: 01/02/2023 CLINICAL DATA:  Elevated troponins, right ventricular dilatation. PE suspected, high probability. EXAM: CT ANGIOGRAPHY CHEST WITH CONTRAST TECHNIQUE: Multidetector CT imaging of the chest was performed using the standard protocol during bolus administration of intravenous contrast. Multiplanar CT image reconstructions and MIPs were obtained to evaluate the vascular anatomy. RADIATION DOSE REDUCTION: This exam was performed according to the departmental dose-optimization program which includes automated exposure control, adjustment of the mA and/or kV according to patient size and/or use of iterative reconstruction technique. CONTRAST:  75mL OMNIPAQUE IOHEXOL 350 MG/ML SOLN COMPARISON:  None Available. FINDINGS: Cardiovascular: Filling defects are seen in the pulmonary arteries bilaterally, with the most proximal clot seen in the distal right main pulmonary artery. RV/LV ratio 1.0. Atherosclerotic calcification of the aorta and coronary arteries. Heart is enlarged. No pericardial effusion. Mediastinum/Nodes: No pathologically enlarged mediastinal, hilar or axillary lymph nodes. Esophagus is grossly unremarkable. Lungs/Pleura: Image quality is degraded by expiratory phase imaging, creating added density in the lungs, as Strickland as by respiratory motion. Minimal subpleural consolidation in  the posterior right lower lobe (6/123). Lungs are otherwise clear. No pleural fluid. Airway is unremarkable. Upper Abdomen: Left renal stones. Severe left renal parenchymal thinning with central low-attenuation, incompletely visualized. Visualized portions of the liver, gallbladder, adrenal glands, kidneys, spleen, pancreas, stomach and bowel are otherwise grossly unremarkable. No upper abdominal adenopathy. Musculoskeletal: Degenerative changes in the spine. Review of the MIP images confirms the above findings. IMPRESSION: 1. Positive for acute PE with CT evidence of right heart strain (RV/LV Ratio = 1.0) consistent with at least submassive (intermediate risk) PE. The presence of right heart strain has been associated with an increased risk of morbidity and mortality. Please refer to the "Code PE Focused" order set in EPIC. Critical Value/emergent results were called by telephone at the time of interpretation on 01/02/2023 at 3:07 pm to provider [facility] , who verbally acknowledged these results. 2. Probable small infarct in the posterior right lower lobe. 3. Left renal stones. 4. Severe left renal parenchymal thinning with central low-attenuation, incompletely visualized. Difficult to exclude hydronephrosis. Consider renal ultrasound in further evaluation, as clinically indicated. 5. Aortic atherosclerosis (ICD10-I70.0). Coronary artery calcification. Electronically Signed   By: Leanna Battles M.D.   On: 01/02/2023 15:07  ECHOCARDIOGRAM COMPLETE  Result Date: 01/01/2023    ECHOCARDIOGRAM REPORT   Patient Name:   Paul Strickland Date of Exam: 01/01/2023 Medical Rec #:  [id number]       Height:       67.0 in Accession #:    [id number]      Weight:       180.0 lb Date of Birth:  12-18-1945      BSA:          1.934 m Patient Age:    88 years        BP:           158/90 mmHg Patient Gender: M               HR:           63 bpm. Exam Location:  Inpatient Procedure: 2D Echo, Color Doppler and Cardiac Doppler  Indications:    NSTEMI  History:        Patient has prior history of Echocardiogram examinations, most                 recent 02/05/2012. CAD; Risk Factors:Hypertension, Diabetes,                 Dyslipidemia and Sleep Apnea.  Sonographer:    Irving Burton Senior RDCS Referring Phys: [id number] AMAN D KANSAL IMPRESSIONS  1. Left ventricular ejection fraction, by estimation, is 45 to 50%. The left ventricle has mildly decreased function. Left ventricular endocardial border not optimally defined to evaluate regional wall motion. Left ventricular diastolic parameters are consistent with Grade I diastolic dysfunction (impaired relaxation).  2. Right ventricular systolic function is mildly reduced. The right ventricular size is moderately enlarged. There is normal pulmonary artery systolic pressure. The estimated right ventricular systolic pressure is 21.5 mmHg.  3. The mitral valve is grossly normal. No evidence of mitral valve regurgitation. No evidence of mitral stenosis.  4. The aortic valve is grossly normal. Aortic valve regurgitation is not visualized. No aortic stenosis is present.  5. The inferior vena cava is normal in size with greater than 50% respiratory variability, suggesting right atrial pressure of 3 mmHg. FINDINGS  Left Ventricle: Left ventricular ejection fraction, by estimation, is 45 to 50%. The left ventricle has mildly decreased function. Left ventricular endocardial border not optimally defined to evaluate regional wall motion. The left ventricular internal cavity size was normal in size. There is no left ventricular hypertrophy. Abnormal (paradoxical) septal motion, consistent with left bundle branch block. Left ventricular diastolic parameters are consistent with Grade I diastolic dysfunction (impaired relaxation). Right Ventricle: The right ventricular size is moderately enlarged. No increase in right ventricular wall thickness. Right ventricular systolic function is mildly reduced. There is normal  pulmonary artery systolic pressure. The tricuspid regurgitant velocity is 2.15 m/s, and with an assumed right atrial pressure of 3 mmHg, the estimated right ventricular systolic pressure is 21.5 mmHg. Left Atrium: Left atrial size was normal in size. Right Atrium: Right atrial size was normal in size. Pericardium: There is no evidence of pericardial effusion. Presence of epicardial fat layer. Mitral Valve: The mitral valve is grossly normal. No evidence of mitral valve regurgitation. No evidence of mitral valve stenosis. Tricuspid Valve: The tricuspid valve is grossly normal. Tricuspid valve regurgitation is trivial. No evidence of tricuspid stenosis. Aortic Valve: The aortic valve is grossly normal. Aortic valve regurgitation is not visualized. No aortic stenosis is present. Pulmonic Valve: The pulmonic valve was grossly normal. Pulmonic valve regurgitation  is not visualized. No evidence of pulmonic stenosis. Aorta: The aortic root was not Strickland visualized. Venous: The inferior vena cava is normal in size with greater than 50% respiratory variability, suggesting right atrial pressure of 3 mmHg. IAS/Shunts: The atrial septum is grossly normal.  LEFT VENTRICLE PLAX 2D LVIDd:         4.80 cm   Diastology LVIDs:         3.40 cm   LV e' medial:    5.98 cm/s LV PW:         0.70 cm   LV E/e' medial:  7.5 LV IVS:        0.70 cm   LV e' lateral:   5.76 cm/s LVOT diam:     2.30 cm   LV E/e' lateral: 7.7 LV SV:         61 LV SV Index:   32 LVOT Area:     4.15 cm  RIGHT VENTRICLE RV S prime:     7.88 cm/s TAPSE (M-mode): 1.7 cm LEFT ATRIUM             Index        RIGHT ATRIUM           Index LA diam:        3.40 cm 1.76 cm/m   RA Area:     15.40 cm LA Vol (A2C):   50.0 ml 25.86 ml/m  RA Volume:   35.40 ml  18.31 ml/m LA Vol (A4C):   40.2 ml 20.79 ml/m LA Biplane Vol: 48.2 ml 24.93 ml/m  AORTIC VALVE LVOT Vmax:   74.10 cm/s LVOT Vmean:  48.000 cm/s LVOT VTI:    0.148 m  AORTA Ao Root diam: 3.40 cm MV E velocity: 44.60  cm/s  TRICUSPID VALVE MV A velocity: 98.50 cm/s  TR Peak grad:   18.5 mmHg MV E/A ratio:  0.45        TR Vmax:        215.00 cm/s                             SHUNTS                            Systemic VTI:  0.15 m                            Systemic Diam: 2.30 cm Lennie Odor MD Electronically signed by Lennie Odor MD Signature Date/Time: 01/01/2023/9:02:23 AM    Final    DG Chest Port 1 View  Result Date: 12/31/2022 CLINICAL DATA:  Dyspnea, hypertension EXAM: PORTABLE CHEST 1 VIEW COMPARISON:  10/14/2012 FINDINGS: Lungs are Strickland expanded, symmetric, and clear. No pneumothorax or pleural effusion. Cardiac size within normal limits. Pulmonary vascularity is normal. Osseous structures are age-appropriate. No acute bone abnormality. IMPRESSION: No active disease. Electronically Signed   By: Helyn Numbers M.D.   On: 12/31/2022 20:44   Disposition   Pt is being discharged home today in good condition.  Follow-up Plans & Appointments     Follow-up Information     [facility] Follow up.   Specialty: Home Health Services Why: Home health arranged. they will contact you to schedule apt within 48hrs post discharge. Contact information: [address] [place] [address] [phone]  Richardean Chimera, MD Follow up in 2 week(s).   Specialty: Family Medicine Why: Hospital follow up Contact information: [address] [place] [address] [phone]                Discharge Instructions     Call MD for:  difficulty breathing, headache or visual disturbances   Complete by: As directed    Diet - low sodium heart healthy   Complete by: As directed    Diet - low sodium heart healthy   Complete by: As directed    Face-to-face encounter (required for Medicare/Medicaid patients)   Complete by: As directed    I Laverda Page certify that this patient is under my care and that I, or a nurse practitioner or physician's assistant working with me, had a  face-to-face encounter that meets the physician face-to-face encounter requirements with this patient on 01/03/2023. The encounter with the patient was in whole, or in part for the following medical condition(s) which is the primary reason for home health care (List medical condition): Deconditioned, dementia, PE   The encounter with the patient was in whole, or in part, for the following medical condition, which is the primary reason for home health care: Deconditioned, dementia, PE   I certify that, based on my findings, the following services are medically necessary home health services: Nursing   Reason for Medically Necessary Home Health Services: Other See Comments   My clinical findings support the need for the above services: OTHER SEE COMMENTS   Further, I certify that my clinical findings support that this patient is homebound due to: Ambulates short distances less than 300 feet   Home Health   Complete by: As directed    To provide the following care/treatments:  PT OT     Increase activity slowly   Complete by: As directed    Increase activity slowly   Complete by: As directed         Discharge Medications   Allergies as of 01/04/2023       Reactions   Penicillins Rash        Medication List     STOP taking these medications    clopidogrel 75 MG tablet Commonly known as: Plavix   isosorbide mononitrate 30 MG 24 hr tablet Commonly known as: IMDUR       TAKE these medications    amLODipine 5 MG tablet Commonly known as: NORVASC Take 1 tablet (5 mg total) by mouth daily.   aspirin EC 81 MG tablet Take 81 mg by mouth daily. Swallow whole.   atorvastatin 80 MG tablet Commonly known as: Lipitor Take 1 tablet (80 mg total) by mouth daily.   rivastigmine 13.3 MG/24HR Commonly known as: EXELON Place 1 patch onto the skin daily. exelon patch for memory   Xarelto Starter Pack Generic drug: Rivaroxaban Starter Pack (15 mg and 20 mg) Follow package  directions: Take one 15mg  tablet by mouth twice a day. On day 22, switch to one 20mg  tablet once a day. Take with food.           Outstanding Labs/Studies     Duration of Discharge Encounter   Greater than 30 minutes including physician time.  Signed, Jonita Albee, PA-C 01/04/2023, 10:58 AM

## 2023-01-04 NOTE — Progress Notes (Signed)
Mobility Specialist Progress Note:    01/04/23 1154  Mobility  Activity Transferred from bed to chair  Level of Assistance +2 (takes two people) Audiological scientist)  Location manager Ambulated (ft) 5 ft  Activity Response Tolerated well  Mobility Referral Yes  $Mobility charge 1 Mobility  Mobility Specialist Start Time (ACUTE ONLY) 0935  Mobility Specialist Stop Time (ACUTE ONLY) 0950  Mobility Specialist Time Calculation (min) (ACUTE ONLY) 15 min   Pt received in bed agreeable to mobility. Needed MinA+2 to stand and transfer to the chair. Required mod verbal and tactile cues to stay focused throughout session. Took a couple steps towards the chair w/o fault. Chair alarm on and all needs met. Family in room.   Thompson Grayer Mobility Specialist  Please contact vis Secure Chat or  Rehab Office [phone]

## 2023-01-04 NOTE — Care Management Important Message (Signed)
Important Message  Patient Details  Name: Paul Strickland MRN: [id number] Date of Birth: 31-May-1945   Important Message Given:  Yes - Medicare IM  Patient left prior to IM delivery will mail a copy to the patient home address.    Ayinde Swim 01/04/2023, 3:34 PM

## 2023-01-04 NOTE — Plan of Care (Signed)
  Problem: Activity: Goal: Ability to tolerate increased activity will improve Outcome: Progressing   Problem: Clinical Measurements: Goal: Diagnostic test results will improve Outcome: Progressing Goal: Cardiovascular complication will be avoided Outcome: Progressing   Problem: Nutrition: Goal: Adequate nutrition will be maintained Outcome: Progressing   Problem: Elimination: Goal: Will not experience complications related to urinary retention Outcome: Progressing   Problem: Pain Management: Goal: General experience of comfort will improve Outcome: Progressing

## 2023-01-08 DIAGNOSIS — I2510 Atherosclerotic heart disease of native coronary artery without angina pectoris: Secondary | ICD-10-CM | POA: Diagnosis not present

## 2023-01-08 DIAGNOSIS — E782 Mixed hyperlipidemia: Secondary | ICD-10-CM | POA: Diagnosis not present

## 2023-01-08 DIAGNOSIS — G309 Alzheimer's disease, unspecified: Secondary | ICD-10-CM | POA: Diagnosis not present

## 2023-01-08 DIAGNOSIS — Z7982 Long term (current) use of aspirin: Secondary | ICD-10-CM | POA: Diagnosis not present

## 2023-01-08 DIAGNOSIS — I214 Non-ST elevation (NSTEMI) myocardial infarction: Secondary | ICD-10-CM | POA: Diagnosis not present

## 2023-01-08 DIAGNOSIS — F02B0 Dementia in other diseases classified elsewhere, moderate, without behavioral disturbance, psychotic disturbance, mood disturbance, and anxiety: Secondary | ICD-10-CM | POA: Diagnosis not present

## 2023-01-08 DIAGNOSIS — Z9181 History of falling: Secondary | ICD-10-CM | POA: Diagnosis not present

## 2023-01-08 DIAGNOSIS — E119 Type 2 diabetes mellitus without complications: Secondary | ICD-10-CM | POA: Diagnosis not present

## 2023-01-08 DIAGNOSIS — I2609 Other pulmonary embolism with acute cor pulmonale: Secondary | ICD-10-CM | POA: Diagnosis not present

## 2023-01-08 DIAGNOSIS — I700 Atherosclerosis of aorta: Secondary | ICD-10-CM | POA: Diagnosis not present

## 2023-01-08 DIAGNOSIS — I5020 Unspecified systolic (congestive) heart failure: Secondary | ICD-10-CM | POA: Diagnosis not present

## 2023-01-08 DIAGNOSIS — I110 Hypertensive heart disease with heart failure: Secondary | ICD-10-CM | POA: Diagnosis not present

## 2023-01-15 ENCOUNTER — Encounter: Payer: Self-pay | Admitting: Physician Assistant

## 2023-01-15 ENCOUNTER — Ambulatory Visit: Payer: PPO | Attending: Physician Assistant | Admitting: Physician Assistant

## 2023-01-15 VITALS — BP 110/50 | HR 62 | Ht 67.0 in | Wt 178.0 lb

## 2023-01-15 DIAGNOSIS — I10 Essential (primary) hypertension: Secondary | ICD-10-CM | POA: Diagnosis not present

## 2023-01-15 DIAGNOSIS — E7800 Pure hypercholesterolemia, unspecified: Secondary | ICD-10-CM | POA: Diagnosis not present

## 2023-01-15 DIAGNOSIS — I2510 Atherosclerotic heart disease of native coronary artery without angina pectoris: Secondary | ICD-10-CM

## 2023-01-15 DIAGNOSIS — I2609 Other pulmonary embolism with acute cor pulmonale: Secondary | ICD-10-CM | POA: Diagnosis not present

## 2023-01-15 MED ORDER — RIVAROXABAN (XARELTO) VTE STARTER PACK (15 & 20 MG): 20.0000 mg | ORAL_TABLET | Freq: Every day | ORAL | 3 refills | Status: AC

## 2023-01-15 NOTE — Progress Notes (Signed)
Cardiology Office Note:    Date:  01/15/2023  ID:  Paul Strickland, DOB 30-Jun-1945, MRN [id number] PCP: Richardean Chimera, MD  [facility] Providers Cardiologist:  Elder Negus, MD       Patient Profile:      Coronary artery disease  NSTEMI in 01/2012 tx medically (culprit likely very small mLCx NSTEMI 09/2012 s/p DES to [facility] of RCA  LHC 10/15/22: pLAD 40, mLAD 40-50, pCFX 30, AVCFX 90, mRCA 30-40, PLOM of RCA 90-95 (PCI), EF 55-65%.  TTE 01/01/23: EF 45-50, Gr 1 DD, mildly reduced RVSF, NL PASP, RVSP 21.5, RAP 3  Pulmonary embolism L peroneal DVT Diabetes mellitus  Hypertension  Hyperlipidemia  Carotid stenosis [place] 03/03/13: Bilat ICA 1-39 Bradycardia - beta-blocker DC'd Dementia  Hx of OSA            History of Present Illness:  Discussed the use of AI scribe software for clinical note transcription with the patient, who gave verbal consent to proceed.  Paul Strickland is a 77 y.o. male who returns for post hospitalization follow up. He was last seen in our office in 2014. He was recently admitted 11/4-11/8 with chest pain and elevated hsT (300>>401) concerning for NSTEMI. After discussion with the family, it was decided to manage him conservatively with med Rx based upon age and dementia. TTE showed EF 45-50, mod RVE. CT showed bilateral submassive PE with evidence of R heart strain. He was not felt to be a candidate for invasive PE management. He was tx with IV Heparin >> Xarelto. Clopidogrel was DCd. [place] was pos for L peroneal DVT.   He is here with his wife, son. They provide the hx due to dementia. The patient has not reported any chest pain, pressure, or tightness since the hospitalization. He is short of breath when walking short distances. This symptom has reportedly worsened since the diagnosis of the PE. The patient spends most of the day sleeping and has difficulty with mobility. However, there has been a noted improvement in the patient's mobility and  endurance since the hospitalization. The patient has not reported any leg swelling or episodes of syncope. The patient sleeps with two pillows under the head, a change from his previous sleeping habits. The patient's weight has decreased from 180lbs to approximately 174lbs since May. The patient's blood pressure has been noted to be low, and he is no longer on antihypertensive medication.  Review of Systems  Gastrointestinal:  Negative for hematochezia and melena.  Genitourinary:  Negative for hematuria.  See HPI     Studies Reviewed:               Risk Assessment/Calculations:             Physical Exam:   VS:  BP (!) 110/50   Pulse 62   Ht 5\' 7"  (1.702 m)   Wt 178 lb (80.7 kg)   SpO2 90%   BMI 27.88 kg/m    Wt Readings from Last 3 Encounters:  01/15/23 178 lb (80.7 kg)  12/31/22 180 lb (81.6 kg)  02/03/13 216 lb (98 kg)    Constitutional:      Appearance: Healthy appearance. Not in distress.  Neck:     Vascular: No JVR. JVD normal.  Pulmonary:     Breath sounds: Normal breath sounds. No wheezing. No rales.  Cardiovascular:     Normal rate. Regular rhythm.     Murmurs: There is no murmur.  Edema:  Peripheral edema absent.  Abdominal:     Palpations: Abdomen is soft.        Assessment and Plan:   Assessment & Plan Other acute pulmonary embolism with acute cor pulmonale (HCC) Pt is s/p recent admission with submassive PE due to L peroneal DVT. Due to advanced age and dementia, he is managed conservatively. He did have low EF noted and evidence of R heart strain on echocardiogram. He seems to be doing well. No signs of volume excess on exam. O2 sats are normal.  -Arrange limited TTE in 3 mos to recheck EF, RV function -Continue Xarelto 15 mg twice daily x total of 21 days, then Xarelto 20 mg once daily  -Continue anticoagulation at least 6 mos; consider indef anticoagulation  -BMET, CBC in 3 mos -Follow up in [place] office in 3 mos (pt lives there and easier than  coming to [facility]) Coronary artery disease involving native coronary artery of native heart without angina pectoris Hx of NSTEMI in 2013 tx medically due to no targets for PCI. Recurrent NSTEMI in 09/2012 tx with DES to [facility] of RCA. Recent admission w submassive PE, R heart strain and elevated hsT. The hsT were fairly flat and likely c/w demand ischemia vs Type II MI in setting of PE. He is not having chest pain. -Clopidogrel was DC'd 2/2 to need for anticoagulation  -Continue ASA 81 mg once daily, Atorvastatin 80 mg once daily  Primary hypertension BP low. He is no longer on meds.  Pure hypercholesterolemia Continue Atorvastatin 80 mg once daily        Dispo:  Return in about 3 months (around 04/17/2023) for Routine Follow Up in our [place] office.  Signed, Tereso Newcomer, PA-C

## 2023-01-15 NOTE — Patient Instructions (Addendum)
Medication Instructions:  Your physician recommends that you continue on your current medications as directed. Please refer to the Current Medication list given to you today.  *If you need a refill on your cardiac medications before your next appointment, please call your pharmacy*   Lab Work: TO BE DONE IN 3 MONTHS: BMET, CBC IN [place]  If you have labs (blood work) drawn today and your tests are completely normal, you will receive your results only by: MyChart Message (if you have MyChart) OR A paper copy in the mail If you have any lab test that is abnormal or we need to change your treatment, we will call you to review the results.   Testing/Procedures: Your physician has requested that you have an LIMITED echocardiogram. Echocardiography is a painless test that uses sound waves to create images of your heart. It provides your doctor with information about the size and shape of your heart and how well your heart's chambers and valves are working. This procedure takes approximately one hour. There are no restrictions for this procedure. TO BE DONE IN [place]  Please do NOT wear cologne, perfume, aftershave, or lotions (deodorant is allowed). Please arrive 15 minutes prior to your appointment time.  Please note: We ask at that you not bring children with you during ultrasound (echo/ vascular) testing. Due to room size and safety concerns, children are not allowed in the ultrasound rooms during exams. Our front office staff cannot provide observation of children in our lobby area while testing is being conducted. An adult accompanying a patient to their appointment will only be allowed in the ultrasound room at the discretion of the ultrasound technician under special circumstances. We apologize for any inconvenience.    Follow-Up: At [facility], you and your health needs are our priority.  As part of our continuing mission to provide you with exceptional heart care, we have created  designated Provider Care Teams.  These Care Teams include your primary Cardiologist (physician) and Advanced Practice Providers (APPs -  Physician Assistants and Nurse Practitioners) who all work together to provide you with the care you need, when you need it.  We recommend signing up for the patient portal called "MyChart".  Sign up information is provided on this After Visit Summary.  MyChart is used to connect with patients for Virtual Visits (Telemedicine).  Patients are able to view lab/test results, encounter notes, upcoming appointments, etc.  Non-urgent messages can be sent to your provider as well.   To learn more about what you can do with MyChart, go to [email or web address].    Your next appointment:   3 month(s)  Provider:    Sharlene Dory, NP Other Instructions

## 2023-01-15 NOTE — Assessment & Plan Note (Signed)
Continue Atorvastatin 80 mg once daily

## 2023-01-15 NOTE — Assessment & Plan Note (Addendum)
Pt is s/p recent admission with submassive PE due to L peroneal DVT. Due to advanced age and dementia, he is managed conservatively. He did have low EF noted and evidence of R heart strain on echocardiogram. He seems to be doing well. No signs of volume excess on exam. O2 sats are normal.  -Arrange limited TTE in 3 mos to recheck EF, RV function -Continue Xarelto 15 mg twice daily x total of 21 days, then Xarelto 20 mg once daily  -Continue anticoagulation at least 6 mos; consider indef anticoagulation  -BMET, CBC in 3 mos -Follow up in [place] office in 3 mos (pt lives there and easier than coming to [facility])

## 2023-01-15 NOTE — Assessment & Plan Note (Addendum)
Hx of NSTEMI in 2013 tx medically due to no targets for PCI. Recurrent NSTEMI in 09/2012 tx with DES to [facility] of RCA. Recent admission w submassive PE, R heart strain and elevated hsT. The hsT were fairly flat and likely c/w demand ischemia vs Type II MI in setting of PE. He is not having chest pain. -Clopidogrel was DC'd 2/2 to need for anticoagulation  -Continue ASA 81 mg once daily, Atorvastatin 80 mg once daily

## 2023-01-15 NOTE — Assessment & Plan Note (Signed)
BP low. He is no longer on meds.

## 2023-01-17 DIAGNOSIS — N390 Urinary tract infection, site not specified: Secondary | ICD-10-CM | POA: Diagnosis not present

## 2023-01-23 ENCOUNTER — Ambulatory Visit: Payer: PPO | Attending: Physician Assistant

## 2023-01-23 DIAGNOSIS — I2609 Other pulmonary embolism with acute cor pulmonale: Secondary | ICD-10-CM | POA: Diagnosis not present

## 2023-01-23 LAB — ECHOCARDIOGRAM LIMITED
Area-P 1/2: 2.36 cm2
Calc EF: 50.4 %
S' Lateral: 3.7 cm
Single Plane A2C EF: 49.9 %
Single Plane A4C EF: 51.3 %

## 2023-01-29 DIAGNOSIS — N3090 Cystitis, unspecified without hematuria: Secondary | ICD-10-CM | POA: Diagnosis not present

## 2023-02-03 DIAGNOSIS — I2699 Other pulmonary embolism without acute cor pulmonale: Secondary | ICD-10-CM | POA: Diagnosis not present

## 2023-02-03 DIAGNOSIS — I214 Non-ST elevation (NSTEMI) myocardial infarction: Secondary | ICD-10-CM | POA: Diagnosis not present

## 2023-02-11 DIAGNOSIS — J101 Influenza due to other identified influenza virus with other respiratory manifestations: Secondary | ICD-10-CM | POA: Diagnosis not present

## 2023-02-11 DIAGNOSIS — R030 Elevated blood-pressure reading, without diagnosis of hypertension: Secondary | ICD-10-CM | POA: Diagnosis not present

## 2023-02-11 DIAGNOSIS — Z20828 Contact with and (suspected) exposure to other viral communicable diseases: Secondary | ICD-10-CM | POA: Diagnosis not present

## 2023-02-11 DIAGNOSIS — R059 Cough, unspecified: Secondary | ICD-10-CM | POA: Diagnosis not present

## 2023-02-11 DIAGNOSIS — Z6826 Body mass index (BMI) 26.0-26.9, adult: Secondary | ICD-10-CM | POA: Diagnosis not present

## 2023-02-25 DIAGNOSIS — R059 Cough, unspecified: Secondary | ICD-10-CM | POA: Diagnosis not present

## 2023-02-25 DIAGNOSIS — Z6825 Body mass index (BMI) 25.0-25.9, adult: Secondary | ICD-10-CM | POA: Diagnosis not present

## 2023-02-25 DIAGNOSIS — R030 Elevated blood-pressure reading, without diagnosis of hypertension: Secondary | ICD-10-CM | POA: Diagnosis not present

## 2023-02-25 DIAGNOSIS — G300 Alzheimer's disease with early onset: Secondary | ICD-10-CM | POA: Diagnosis not present

## 2023-02-28 DIAGNOSIS — G309 Alzheimer's disease, unspecified: Secondary | ICD-10-CM | POA: Diagnosis not present

## 2023-02-28 DIAGNOSIS — I5020 Unspecified systolic (congestive) heart failure: Secondary | ICD-10-CM | POA: Diagnosis not present

## 2023-02-28 DIAGNOSIS — I700 Atherosclerosis of aorta: Secondary | ICD-10-CM | POA: Diagnosis not present

## 2023-02-28 DIAGNOSIS — I214 Non-ST elevation (NSTEMI) myocardial infarction: Secondary | ICD-10-CM | POA: Diagnosis not present

## 2023-02-28 DIAGNOSIS — I2510 Atherosclerotic heart disease of native coronary artery without angina pectoris: Secondary | ICD-10-CM | POA: Diagnosis not present

## 2023-02-28 DIAGNOSIS — F02B0 Dementia in other diseases classified elsewhere, moderate, without behavioral disturbance, psychotic disturbance, mood disturbance, and anxiety: Secondary | ICD-10-CM | POA: Diagnosis not present

## 2023-02-28 DIAGNOSIS — I110 Hypertensive heart disease with heart failure: Secondary | ICD-10-CM | POA: Diagnosis not present

## 2023-04-18 ENCOUNTER — Ambulatory Visit: Payer: PPO | Admitting: Nurse Practitioner

## 2023-04-26 ENCOUNTER — Ambulatory Visit: Payer: PPO | Attending: Nurse Practitioner | Admitting: Nurse Practitioner

## 2023-04-26 ENCOUNTER — Encounter: Payer: Self-pay | Admitting: Nurse Practitioner

## 2023-04-26 VITALS — BP 101/60 | HR 62 | Ht 67.0 in | Wt 180.0 lb

## 2023-04-26 DIAGNOSIS — Z86711 Personal history of pulmonary embolism: Secondary | ICD-10-CM | POA: Diagnosis not present

## 2023-04-26 DIAGNOSIS — Z86718 Personal history of other venous thrombosis and embolism: Secondary | ICD-10-CM

## 2023-04-26 DIAGNOSIS — I10 Essential (primary) hypertension: Secondary | ICD-10-CM

## 2023-04-26 DIAGNOSIS — E7800 Pure hypercholesterolemia, unspecified: Secondary | ICD-10-CM | POA: Diagnosis not present

## 2023-04-26 DIAGNOSIS — I5032 Chronic diastolic (congestive) heart failure: Secondary | ICD-10-CM

## 2023-04-26 DIAGNOSIS — I2609 Other pulmonary embolism with acute cor pulmonale: Secondary | ICD-10-CM

## 2023-04-26 DIAGNOSIS — I2510 Atherosclerotic heart disease of native coronary artery without angina pectoris: Secondary | ICD-10-CM | POA: Diagnosis not present

## 2023-04-26 NOTE — Patient Instructions (Signed)
 Medication Instructions:  Continue all current medications.   Labwork: none  Testing/Procedures: none  Follow-Up: 6 months   Any Other Special Instructions Will Be Listed Below (If Applicable).   If you need a refill on your cardiac medications before your next appointment, please call your pharmacy.

## 2023-04-26 NOTE — Progress Notes (Signed)
 Cardiology Office Note:  .   Date:  04/26/2023 ID:  Paul Strickland, DOB 1945-05-01, MRN [id number] PCP: Paul Chimera, MD  [facility] Providers Cardiologist:  Paul Negus, MD    History of Present Illness: .   Paul Strickland is a 78 y.o. male with PMH of acute PE (November 2024), history of DVT, CAD, hypertension, hypercholesterolemia, type 2 diabetes, carotid artery stenosis, bradycardia, history of OSA, and dementia who presents today for 78-month follow-up appointment.  Previous cardiovascular history of NSTEMI in 2013 that was treated medically.  In 2014 he was also diagnosed as NSTEMI and underwent drug-eluting stent to PL OM of RCA.  Most recently, underwent left heart cath in August 2024-see full report below.  Hospital admission in November 2024 with chest pain and elevated troponins, concerning for NSTEMI.  Was decided to treat conservatively based upon his history of dementia and his advanced age.  Echocardiogram revealed EF 45 to 50%, moderate RVE.  CT revealed bilateral submassive PE with evidence of right heart strain, was not found to be a candidate for invasive management.  Lower extremity ultrasound was positive for left peroneal DVT.  Last seen by Tereso Newcomer, PA-C on January 15, 2023.  Was noted to be short of breath when walking short distances.  This symptom had reportedly worsened since diagnosed with PE.  Was spending most of the day sleeping, had difficulty with mobility.  However there was a noted improvement in his mobility and endurance.  Admitted to two-pillow orthopnea.  Weight decreased.  Blood pressure was noted to be low.  Echocardiogram was arranged to be done in 3 months however this was done earlier than expected-see report below.  Today he presents for 13-month follow-up appointment.  Pt is a poor historian d/t history of dementia. Son and wife are in room to provide history. Pt is doing better since last office visit and progressing well.  Family says his appetite is returning and eating well. Walks around 5 laps with his son in the house, his endurance is building. Son and wife do state he continues to have shortness of breath when he is walking, but overall doing well. Denies any chest pain, palpitations, syncope, presyncope, dizziness, orthopnea, PND, swelling or significant weight changes, acute bleeding, or claudication.  ROS: Negative. See HPI.   Studies Reviewed: Marland Kitchen    EKG: EKG is not ordered today.   Limited Echo 12/2022:   1. Limited study.   2. Left ventricular ejection fraction, by estimation, is 50 to 55%. The  left ventricle has low normal function. The left ventricle has no regional  wall motion abnormalities.   3. Right ventricular systolic function is normal. The right ventricular  size is mildly enlarged.   4. The mitral valve is grossly normal. Trivial mitral valve  regurgitation.   5. The aortic valve is tricuspid. Aortic valve regurgitation is not  visualized.   6. The inferior vena cava is normal in size with greater than 50%  respiratory variability, suggesting right atrial pressure of 3 mmHg.   Comparison(s): Prior images reviewed side by side. LVEF low normal range at 50-55%. Mild RV enlargement with normal contraction.  Echo complete 12/2022:  1. Left ventricular ejection fraction, by estimation, is 45 to 50%. The  left ventricle has mildly decreased function. Left ventricular endocardial  border not optimally defined to evaluate regional wall motion. Left  ventricular diastolic parameters are  consistent with Grade I diastolic dysfunction (impaired relaxation).   2.  Right ventricular systolic function is mildly reduced. The right  ventricular size is moderately enlarged. There is normal pulmonary artery  systolic pressure. The estimated right ventricular systolic pressure is  21.5 mmHg.   3. The mitral valve is grossly normal. No evidence of mitral valve  regurgitation. No evidence of mitral  stenosis.   4. The aortic valve is grossly normal. Aortic valve regurgitation is not  visualized. No aortic stenosis is present.   5. The inferior vena cava is normal in size with greater than 50%  respiratory variability, suggesting right atrial pressure of 3 mmHg.   Physical Exam:   VS:  BP 101/60 (BP Location: Left Arm, Patient Position: Sitting, Cuff Size: Normal)   Pulse 62   Ht 5\' 7"  (1.702 m)   Wt 180 lb (81.6 kg)   SpO2 100%   BMI 28.19 kg/m    Wt Readings from Last 3 Encounters:  04/26/23 180 lb (81.6 kg)  01/15/23 178 lb (80.7 kg)  12/31/22 180 lb (81.6 kg)    GEN: Well nourished, well developed in no acute distress NECK: No JVD; No carotid bruits CARDIAC: S1/S2, RRR, no murmurs, rubs, gallops RESPIRATORY:  Clear to auscultation without rales, wheezing or rhonchi  ABDOMEN: Soft, non-tender, non-distended EXTREMITIES:  No edema; No deformity   ASSESSMENT AND PLAN: .    HFimpEF Stage C, NYHA class I-II symptoms. EF 45-50% in early November 2024, with EF improvement to 50-55%. This was d/t right heart strain from PE noted below. Euvolemic and well compensated on exam. Currently not on BP medication d/t his past BP trends. No medication changes at this time. GDMT limited. Low sodium diet, fluid restriction <2L, and daily weights encouraged. Educated to contact our office for weight gain of 2 lbs overnight or 5 lbs in one week.   Hx PE/DVT Doing well. Tolerating OAC well. Continue Xarelto for at least 6 months. Will obtain CBC, BMET as previously arranged/ordered from last office visit.  Continue to follow with PCP.   CAD Stable with no anginal symptoms. No indication for ischemic evaluation. Plavix was previously d/c due to [facility]. Continue current medication regimen. Heart healthy diet and regular cardiovascular exercise as tolerated encouraged. Obtaining labs as mentioned above.   HTN BP stable/soft. Asymptomatic. No longer on medication. Heart healthy diet and regular  cardiovascular exercise as tolerated encouraged.   Pure hypercholesterolemia LDL 107 12/2022. Continue atorvastatin. Heart healthy diet and regular cardiovascular exercise as tolerated encouraged. Recommend arranging repeating labs at next office visit.       Dispo: Pt is requesting care closer to home. Follow-up with MD/APP in 6 months or sooner if anything changes.   Signed, Sharlene Dory, NP

## 2023-05-02 DIAGNOSIS — R3989 Other symptoms and signs involving the genitourinary system: Secondary | ICD-10-CM | POA: Diagnosis not present

## 2023-05-02 DIAGNOSIS — R300 Dysuria: Secondary | ICD-10-CM | POA: Diagnosis not present

## 2023-06-07 ENCOUNTER — Ambulatory Visit: Payer: PPO | Admitting: Nurse Practitioner

## 2023-07-02 DIAGNOSIS — Z6826 Body mass index (BMI) 26.0-26.9, adult: Secondary | ICD-10-CM | POA: Diagnosis not present

## 2023-07-02 DIAGNOSIS — I10 Essential (primary) hypertension: Secondary | ICD-10-CM | POA: Diagnosis not present

## 2023-07-02 DIAGNOSIS — E1122 Type 2 diabetes mellitus with diabetic chronic kidney disease: Secondary | ICD-10-CM | POA: Diagnosis not present

## 2023-07-02 DIAGNOSIS — I25119 Atherosclerotic heart disease of native coronary artery with unspecified angina pectoris: Secondary | ICD-10-CM | POA: Diagnosis not present

## 2023-07-02 DIAGNOSIS — G300 Alzheimer's disease with early onset: Secondary | ICD-10-CM | POA: Diagnosis not present

## 2023-07-02 DIAGNOSIS — Z0001 Encounter for general adult medical examination with abnormal findings: Secondary | ICD-10-CM | POA: Diagnosis not present

## 2023-09-26 DIAGNOSIS — I10 Essential (primary) hypertension: Secondary | ICD-10-CM | POA: Diagnosis not present

## 2023-09-26 DIAGNOSIS — I5020 Unspecified systolic (congestive) heart failure: Secondary | ICD-10-CM | POA: Diagnosis not present

## 2023-09-26 DIAGNOSIS — E782 Mixed hyperlipidemia: Secondary | ICD-10-CM | POA: Diagnosis not present

## 2023-09-26 DIAGNOSIS — G309 Alzheimer's disease, unspecified: Secondary | ICD-10-CM | POA: Diagnosis not present

## 2023-10-02 DIAGNOSIS — I10 Essential (primary) hypertension: Secondary | ICD-10-CM | POA: Diagnosis not present

## 2023-10-02 DIAGNOSIS — H6123 Impacted cerumen, bilateral: Secondary | ICD-10-CM | POA: Diagnosis not present

## 2023-10-02 DIAGNOSIS — I5020 Unspecified systolic (congestive) heart failure: Secondary | ICD-10-CM | POA: Diagnosis not present

## 2023-10-02 DIAGNOSIS — I25111 Atherosclerotic heart disease of native coronary artery with angina pectoris with documented spasm: Secondary | ICD-10-CM | POA: Diagnosis not present

## 2023-10-02 DIAGNOSIS — F0280 Dementia in other diseases classified elsewhere without behavioral disturbance: Secondary | ICD-10-CM | POA: Diagnosis not present

## 2023-10-02 DIAGNOSIS — G309 Alzheimer's disease, unspecified: Secondary | ICD-10-CM | POA: Diagnosis not present

## 2023-10-02 DIAGNOSIS — I2699 Other pulmonary embolism without acute cor pulmonale: Secondary | ICD-10-CM | POA: Diagnosis not present

## 2023-10-02 DIAGNOSIS — E119 Type 2 diabetes mellitus without complications: Secondary | ICD-10-CM | POA: Diagnosis not present

## 2023-11-01 DIAGNOSIS — I25111 Atherosclerotic heart disease of native coronary artery with angina pectoris with documented spasm: Secondary | ICD-10-CM | POA: Diagnosis not present

## 2023-11-01 DIAGNOSIS — I10 Essential (primary) hypertension: Secondary | ICD-10-CM | POA: Diagnosis not present

## 2023-11-01 DIAGNOSIS — R399 Unspecified symptoms and signs involving the genitourinary system: Secondary | ICD-10-CM | POA: Diagnosis not present

## 2023-11-01 DIAGNOSIS — G309 Alzheimer's disease, unspecified: Secondary | ICD-10-CM | POA: Diagnosis not present

## 2023-11-01 DIAGNOSIS — I2699 Other pulmonary embolism without acute cor pulmonale: Secondary | ICD-10-CM | POA: Diagnosis not present

## 2023-11-01 DIAGNOSIS — I5020 Unspecified systolic (congestive) heart failure: Secondary | ICD-10-CM | POA: Diagnosis not present

## 2023-11-01 DIAGNOSIS — E119 Type 2 diabetes mellitus without complications: Secondary | ICD-10-CM | POA: Diagnosis not present

## 2023-11-01 DIAGNOSIS — F0280 Dementia in other diseases classified elsewhere without behavioral disturbance: Secondary | ICD-10-CM | POA: Diagnosis not present

## 2023-11-26 DIAGNOSIS — I10 Essential (primary) hypertension: Secondary | ICD-10-CM | POA: Diagnosis not present

## 2023-11-26 DIAGNOSIS — E119 Type 2 diabetes mellitus without complications: Secondary | ICD-10-CM | POA: Diagnosis not present

## 2023-11-26 DIAGNOSIS — I5020 Unspecified systolic (congestive) heart failure: Secondary | ICD-10-CM | POA: Diagnosis not present

## 2023-11-26 DIAGNOSIS — E782 Mixed hyperlipidemia: Secondary | ICD-10-CM | POA: Diagnosis not present

## 2023-11-29 DIAGNOSIS — I5020 Unspecified systolic (congestive) heart failure: Secondary | ICD-10-CM | POA: Diagnosis not present

## 2023-11-29 DIAGNOSIS — G309 Alzheimer's disease, unspecified: Secondary | ICD-10-CM | POA: Diagnosis not present

## 2023-11-29 DIAGNOSIS — I10 Essential (primary) hypertension: Secondary | ICD-10-CM | POA: Diagnosis not present

## 2023-11-29 DIAGNOSIS — I25111 Atherosclerotic heart disease of native coronary artery with angina pectoris with documented spasm: Secondary | ICD-10-CM | POA: Diagnosis not present

## 2023-11-29 DIAGNOSIS — I2699 Other pulmonary embolism without acute cor pulmonale: Secondary | ICD-10-CM | POA: Diagnosis not present

## 2023-11-29 DIAGNOSIS — E119 Type 2 diabetes mellitus without complications: Secondary | ICD-10-CM | POA: Diagnosis not present

## 2023-11-29 DIAGNOSIS — F05 Delirium due to known physiological condition: Secondary | ICD-10-CM | POA: Diagnosis not present

## 2023-11-29 DIAGNOSIS — F0280 Dementia in other diseases classified elsewhere without behavioral disturbance: Secondary | ICD-10-CM | POA: Diagnosis not present

## 2023-11-29 DIAGNOSIS — R3911 Hesitancy of micturition: Secondary | ICD-10-CM | POA: Diagnosis not present

## 2023-12-27 DIAGNOSIS — G309 Alzheimer's disease, unspecified: Secondary | ICD-10-CM | POA: Diagnosis not present

## 2023-12-27 DIAGNOSIS — R3911 Hesitancy of micturition: Secondary | ICD-10-CM | POA: Diagnosis not present

## 2023-12-27 DIAGNOSIS — F0280 Dementia in other diseases classified elsewhere without behavioral disturbance: Secondary | ICD-10-CM | POA: Diagnosis not present

## 2023-12-27 DIAGNOSIS — E119 Type 2 diabetes mellitus without complications: Secondary | ICD-10-CM | POA: Diagnosis not present

## 2023-12-27 DIAGNOSIS — F05 Delirium due to known physiological condition: Secondary | ICD-10-CM | POA: Diagnosis not present

## 2023-12-27 DIAGNOSIS — I5020 Unspecified systolic (congestive) heart failure: Secondary | ICD-10-CM | POA: Diagnosis not present

## 2023-12-27 DIAGNOSIS — I2699 Other pulmonary embolism without acute cor pulmonale: Secondary | ICD-10-CM | POA: Diagnosis not present

## 2023-12-27 DIAGNOSIS — I25111 Atherosclerotic heart disease of native coronary artery with angina pectoris with documented spasm: Secondary | ICD-10-CM | POA: Diagnosis not present

## 2023-12-27 DIAGNOSIS — I10 Essential (primary) hypertension: Secondary | ICD-10-CM | POA: Diagnosis not present

## 2024-01-02 DIAGNOSIS — R399 Unspecified symptoms and signs involving the genitourinary system: Secondary | ICD-10-CM | POA: Diagnosis not present

## 2024-01-22 DIAGNOSIS — N1832 Chronic kidney disease, stage 3b: Secondary | ICD-10-CM | POA: Diagnosis not present

## 2024-01-22 DIAGNOSIS — I25119 Atherosclerotic heart disease of native coronary artery with unspecified angina pectoris: Secondary | ICD-10-CM | POA: Diagnosis not present

## 2024-01-22 DIAGNOSIS — G300 Alzheimer's disease with early onset: Secondary | ICD-10-CM | POA: Diagnosis not present

## 2024-01-22 DIAGNOSIS — E1122 Type 2 diabetes mellitus with diabetic chronic kidney disease: Secondary | ICD-10-CM | POA: Diagnosis not present

## 2024-01-22 DIAGNOSIS — I482 Chronic atrial fibrillation, unspecified: Secondary | ICD-10-CM | POA: Diagnosis not present

## 2024-01-22 DIAGNOSIS — I10 Essential (primary) hypertension: Secondary | ICD-10-CM | POA: Diagnosis not present

## 2024-01-22 DIAGNOSIS — Z23 Encounter for immunization: Secondary | ICD-10-CM | POA: Diagnosis not present

## 2024-01-22 DIAGNOSIS — Z6826 Body mass index (BMI) 26.0-26.9, adult: Secondary | ICD-10-CM | POA: Diagnosis not present

## 2024-03-09 NOTE — Progress Notes (Signed)
 CRYSTAL ELLWOOD                                          MRN: [id number]   03/09/2024   The VBCI Quality Team Specialist reviewed this patient medical record for the purposes of chart review for care gap closure. The following were reviewed: chart review for care gap closure-kidney health evaluation for diabetes:eGFR  and uACR.    VBCI Quality Team
